# Patient Record
Sex: Male | Born: 1945 | Race: White | Hispanic: No | Smoking: Former smoker
Health system: Southern US, Community
[De-identification: ages and names within clinical notes are randomized; demographics above are authoritative.]

## PROBLEM LIST (undated history)

## (undated) DIAGNOSIS — K219 Gastro-esophageal reflux disease without esophagitis: Secondary | ICD-10-CM

## (undated) DIAGNOSIS — I340 Nonrheumatic mitral (valve) insufficiency: Secondary | ICD-10-CM

## (undated) DIAGNOSIS — I2510 Atherosclerotic heart disease of native coronary artery without angina pectoris: Principal | ICD-10-CM

## (undated) DIAGNOSIS — N200 Calculus of kidney: Secondary | ICD-10-CM

## (undated) DIAGNOSIS — I255 Ischemic cardiomyopathy: Secondary | ICD-10-CM

## (undated) DIAGNOSIS — T7840XA Allergy, unspecified, initial encounter: Secondary | ICD-10-CM

## (undated) DIAGNOSIS — F419 Anxiety disorder, unspecified: Secondary | ICD-10-CM

## (undated) DIAGNOSIS — E890 Postprocedural hypothyroidism: Secondary | ICD-10-CM

## (undated) DIAGNOSIS — R739 Hyperglycemia, unspecified: Secondary | ICD-10-CM

## (undated) DIAGNOSIS — E669 Obesity, unspecified: Secondary | ICD-10-CM

## (undated) DIAGNOSIS — I10 Essential (primary) hypertension: Secondary | ICD-10-CM

## (undated) DIAGNOSIS — C44111 Basal cell carcinoma of skin of unspecified eyelid, including canthus: Secondary | ICD-10-CM

## (undated) DIAGNOSIS — M1990 Unspecified osteoarthritis, unspecified site: Secondary | ICD-10-CM

## (undated) DIAGNOSIS — N401 Enlarged prostate with lower urinary tract symptoms: Secondary | ICD-10-CM

## (undated) DIAGNOSIS — E785 Hyperlipidemia, unspecified: Secondary | ICD-10-CM

## (undated) DIAGNOSIS — I519 Heart disease, unspecified: Secondary | ICD-10-CM

## (undated) DIAGNOSIS — E7800 Pure hypercholesterolemia, unspecified: Secondary | ICD-10-CM

## (undated) DIAGNOSIS — N138 Other obstructive and reflux uropathy: Secondary | ICD-10-CM

## (undated) HISTORY — PX: RENAL BIOPSY: SHX156

## (undated) HISTORY — DX: Heart disease, unspecified: I51.9

## (undated) HISTORY — DX: Calculus of kidney: N20.0

## (undated) HISTORY — DX: Pure hypercholesterolemia, unspecified: E78.00

## (undated) HISTORY — DX: Atherosclerotic heart disease of native coronary artery without angina pectoris: I25.10

## (undated) HISTORY — DX: Anxiety disorder, unspecified: F41.9

## (undated) HISTORY — DX: Benign prostatic hyperplasia with lower urinary tract symptoms: N40.1

## (undated) HISTORY — DX: Ischemic cardiomyopathy: I25.5

## (undated) HISTORY — PX: THYROIDECTOMY: SHX17

## (undated) HISTORY — DX: Hyperglycemia, unspecified: R73.9

## (undated) HISTORY — PX: FINGER AMPUTATION: SHX636

## (undated) HISTORY — PX: APPENDECTOMY: SHX54

## (undated) HISTORY — DX: Nonrheumatic mitral (valve) insufficiency: I34.0

## (undated) HISTORY — DX: Gastro-esophageal reflux disease without esophagitis: K21.9

## (undated) HISTORY — DX: Hyperlipidemia, unspecified: E78.5

## (undated) HISTORY — PX: CARDIAC SURGERY: SHX584

## (undated) HISTORY — DX: Basal cell carcinoma of skin of unspecified eyelid, including canthus: C44.111

## (undated) HISTORY — DX: Postprocedural hypothyroidism: E89.0

## (undated) HISTORY — DX: Obesity, unspecified: E66.9

## (undated) HISTORY — DX: Allergy, unspecified, initial encounter: T78.40XA

## (undated) HISTORY — PX: TONSILLECTOMY AND ADENOIDECTOMY: SUR1326

## (undated) HISTORY — DX: Other obstructive and reflux uropathy: N13.8

## (undated) HISTORY — DX: Essential (primary) hypertension: I10

## (undated) HISTORY — DX: Unspecified osteoarthritis, unspecified site: M19.90

---

## 2000-06-14 DIAGNOSIS — I2510 Atherosclerotic heart disease of native coronary artery without angina pectoris: Secondary | ICD-10-CM

## 2000-06-14 HISTORY — DX: Atherosclerotic heart disease of native coronary artery without angina pectoris: I25.10

## 2001-01-12 HISTORY — PX: PARTIAL NEPHRECTOMY: SHX414

## 2001-01-27 ENCOUNTER — Encounter: Payer: Self-pay | Admitting: Urology

## 2001-02-01 ENCOUNTER — Inpatient Hospital Stay (HOSPITAL_COMMUNITY): Admission: RE | Admit: 2001-02-01 | Discharge: 2001-02-19 | Payer: Self-pay | Admitting: Urology

## 2001-02-01 ENCOUNTER — Encounter (INDEPENDENT_AMBULATORY_CARE_PROVIDER_SITE_OTHER): Payer: Self-pay | Admitting: Specialist

## 2001-02-02 ENCOUNTER — Encounter: Payer: Self-pay | Admitting: Cardiovascular Disease

## 2001-02-09 ENCOUNTER — Encounter: Payer: Self-pay | Admitting: Thoracic Surgery (Cardiothoracic Vascular Surgery)

## 2001-02-15 ENCOUNTER — Encounter: Payer: Self-pay | Admitting: Thoracic Surgery (Cardiothoracic Vascular Surgery)

## 2001-02-16 ENCOUNTER — Encounter: Payer: Self-pay | Admitting: Thoracic Surgery (Cardiothoracic Vascular Surgery)

## 2001-02-17 ENCOUNTER — Encounter: Payer: Self-pay | Admitting: Cardiology

## 2001-02-17 ENCOUNTER — Encounter: Payer: Self-pay | Admitting: Thoracic Surgery (Cardiothoracic Vascular Surgery)

## 2001-02-18 ENCOUNTER — Encounter: Payer: Self-pay | Admitting: Thoracic Surgery (Cardiothoracic Vascular Surgery)

## 2005-06-14 HISTORY — PX: CORONARY ARTERY BYPASS GRAFT: SHX141

## 2005-09-03 ENCOUNTER — Ambulatory Visit: Payer: Self-pay | Admitting: Cardiovascular Disease

## 2005-09-10 ENCOUNTER — Ambulatory Visit: Payer: Self-pay

## 2005-11-11 ENCOUNTER — Encounter: Admission: RE | Admit: 2005-11-11 | Discharge: 2005-11-11 | Payer: Self-pay | Admitting: Endocrinology

## 2005-11-22 ENCOUNTER — Encounter: Admission: RE | Admit: 2005-11-22 | Discharge: 2005-11-22 | Payer: Self-pay | Admitting: Endocrinology

## 2006-04-11 ENCOUNTER — Ambulatory Visit: Payer: Self-pay | Admitting: Gastroenterology

## 2006-04-26 ENCOUNTER — Ambulatory Visit: Payer: Self-pay | Admitting: Gastroenterology

## 2008-04-01 ENCOUNTER — Ambulatory Visit: Payer: Self-pay | Admitting: Cardiovascular Disease

## 2008-04-08 ENCOUNTER — Encounter: Payer: Self-pay | Admitting: Cardiovascular Disease

## 2008-04-08 ENCOUNTER — Ambulatory Visit: Payer: Self-pay

## 2008-04-17 ENCOUNTER — Ambulatory Visit: Payer: Self-pay | Admitting: Cardiovascular Disease

## 2008-04-17 LAB — CONVERTED CEMR LAB
BUN: 22 mg/dL (ref 6–23)
Chloride: 104 meq/L (ref 96–112)
Creatinine, Ser: 1.1 mg/dL (ref 0.4–1.5)
Sodium: 141 meq/L (ref 135–145)

## 2008-04-23 ENCOUNTER — Ambulatory Visit (HOSPITAL_COMMUNITY): Admission: RE | Admit: 2008-04-23 | Discharge: 2008-04-23 | Payer: Self-pay | Admitting: Cardiovascular Disease

## 2008-04-30 ENCOUNTER — Ambulatory Visit: Payer: Self-pay | Admitting: Cardiovascular Disease

## 2008-05-14 DIAGNOSIS — C44111 Basal cell carcinoma of skin of unspecified eyelid, including canthus: Secondary | ICD-10-CM

## 2008-05-14 HISTORY — DX: Basal cell carcinoma of skin of unspecified eyelid, including canthus: C44.111

## 2008-05-17 ENCOUNTER — Ambulatory Visit: Payer: Self-pay | Admitting: Internal Medicine

## 2008-05-22 ENCOUNTER — Ambulatory Visit: Payer: Self-pay

## 2008-06-04 ENCOUNTER — Ambulatory Visit: Payer: Self-pay | Admitting: Cardiovascular Disease

## 2008-06-04 LAB — CONVERTED CEMR LAB
Chloride: 105 meq/L (ref 96–112)
Creatinine, Ser: 0.9 mg/dL (ref 0.4–1.5)
Eosinophils Absolute: 0.3 10*3/uL (ref 0.0–0.7)
Eosinophils Relative: 3.9 % (ref 0.0–5.0)
GFR calc Af Amer: 111 mL/min
GFR calc non Af Amer: 91 mL/min
Hemoglobin: 15.5 g/dL (ref 13.0–17.0)
INR: 0.9 (ref 0.8–1.0)
Neutro Abs: 5 10*3/uL (ref 1.4–7.7)
Neutrophils Relative %: 55.5 % (ref 43.0–77.0)
Potassium: 4.2 meq/L (ref 3.5–5.1)
Prothrombin Time: 9.7 s — ABNORMAL LOW (ref 10.9–13.3)
RBC: 4.8 M/uL (ref 4.22–5.81)
Sodium: 142 meq/L (ref 135–145)

## 2008-06-05 ENCOUNTER — Inpatient Hospital Stay (HOSPITAL_BASED_OUTPATIENT_CLINIC_OR_DEPARTMENT_OTHER): Admission: RE | Admit: 2008-06-05 | Discharge: 2008-06-05 | Payer: Self-pay | Admitting: Cardiology

## 2008-06-05 ENCOUNTER — Ambulatory Visit: Payer: Self-pay | Admitting: Cardiology

## 2008-07-03 ENCOUNTER — Ambulatory Visit: Payer: Self-pay | Admitting: Internal Medicine

## 2008-10-31 DIAGNOSIS — E785 Hyperlipidemia, unspecified: Secondary | ICD-10-CM

## 2008-10-31 DIAGNOSIS — I10 Essential (primary) hypertension: Secondary | ICD-10-CM | POA: Insufficient documentation

## 2008-10-31 DIAGNOSIS — I219 Acute myocardial infarction, unspecified: Secondary | ICD-10-CM | POA: Insufficient documentation

## 2008-10-31 DIAGNOSIS — C649 Malignant neoplasm of unspecified kidney, except renal pelvis: Secondary | ICD-10-CM | POA: Insufficient documentation

## 2008-11-04 ENCOUNTER — Ambulatory Visit: Payer: Self-pay | Admitting: Cardiovascular Disease

## 2008-11-06 ENCOUNTER — Ambulatory Visit: Payer: Self-pay | Admitting: Internal Medicine

## 2009-03-03 ENCOUNTER — Ambulatory Visit: Payer: Self-pay | Admitting: Cardiovascular Disease

## 2009-08-18 ENCOUNTER — Ambulatory Visit: Payer: Self-pay | Admitting: Cardiovascular Disease

## 2010-02-12 ENCOUNTER — Ambulatory Visit: Payer: Self-pay | Admitting: Cardiovascular Disease

## 2010-03-27 ENCOUNTER — Telehealth (INDEPENDENT_AMBULATORY_CARE_PROVIDER_SITE_OTHER): Payer: Self-pay | Admitting: *Deleted

## 2010-07-16 NOTE — Progress Notes (Signed)
  Pt signed ROI for Records to be sent to [facility]) sent to [employer]  March 27, 2010 11:49 AM     Appended Document:  Pt signed ROI for Recent records to be sent to [facility] [place], sent Bypass records from 2002 to Attn: Jan Curtis fax [phone]

## 2010-07-16 NOTE — Assessment & Plan Note (Signed)
Summary: f51m   CC:  check up.  History of Present Illness: Andre Cook is seen today for F/U of ischemic DCM.  His insurance company has denied implantation of an AICD.  I have dictated a letter to clarify this.  He is funcitonal class 2 with CABG in 2002, an occluded SVG to RCA on cath 05/2008.  EF by echo 03/2008 was 25-35% and by MRI 35% with a dense (11% by volume) inferior wall scar.  He ha mild exertinal edema.  No SCCP palpitations mild LE edema and no syncope.  He has bee compliant with his medications. and needs a refill on protonix.  I spoke with Dr. Johney Frame and our research nurse Paula Compton.  Dr. Johney Frame felt the insurance company should cover an AICD and Paula Compton indicated that he couldn't be randomized in the Determine Trial without paying for the procedure out of pocket.  He complains of occasional SSCP which sounds atypical, sharp and shooting across his back He has occasional "PVC;s"  or skikps but no syncope  He thinks the [place] will place a defibrilator and we have faxed his records to Dr Gerlene Fee.  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 Take One Tablet By Mouth Twice A Day 2)  Norvasc 10 Mg Tabs (Amlodipine Besylate) .Marland Kitchen.. 1 Tab By Mouth Once Daily 3)  Pravachol 40 Mg Tabs (Pravastatin Sodium) .Marland Kitchen.. 1 Tab By Mouth Once Daily 4)  Bc Fast Pain Relief 650-195-33.3 Mg Pack (Aspirin-Salicylamide-Caffeine) .... As Needed 5)  Ramipril 10 Mg Caps (Ramipril) .Marland Kitchen.. 1 Tab By Mouth Two Times A Day 6)  Synthroid 112 Mcg Tabs (Levothyroxine Sodium) .Marland Kitchen.. 1 Tab By Mouth Once Daily 7)  Hydrochlorothiazide 25 Mg Tabs (Hydrochlorothiazide) .... Take One Tablet By Mouth Daily. 8)  Cvs  Omeprazole 20 Mg Tbec (Omeprazole) .... Tab By Mouth Once Daily 9)  Niaspan 750 Mg Cr-Tabs (Niacin (Antihyperlipidemic)) .Marland Kitchen.. 1 Tab By Mouth Once Daily 10)  Isosorbide Mononitrate Cr 30 Mg Xr24h-Tab (Isosorbide Mononitrate) .... Take One Tablet By Mouth Daily 11)  Vitamin D 1000 Unit Tabs (Cholecalciferol) .Marland Kitchen.. 1 Tab By Mouth Once Daily  Allergies (verified): No Known Drug Allergies  Past History:  Past Medical History: Last updated: 10/31/2008 . Decreased left ventricular function   Past Surgical History: Last updated: 10/31/2008   Median sternotomy for coronary artery bypass grafting x 4 (left internal mammary artery to distal left anterior descending coronary artery, saphenous vein graft to first diagonal branch, saphenous vein graft to circumflex marginal branch, saphenous vein graft to posterior descending coronary artery). SURGEON:  Salvatore Decent. Cornelius Moras, M.D.  Family History: Last updated: 10/31/2008  noncontributory.   Social History: Last updated: 10/31/2008  He is unmarried.  He has no children.  He does not use   cigarettes or alcohol or recreational drugs.  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      Review of Systems       Denies fever, malais, weight loss, blurry vision, decreased visual acuity, cough, sputum, SOB, hemoptysis, pleuritic pain, palpitaitons, heartburn, abdominal pain, melena, lower extremity edema, claudication, or rash.   Vital Signs:  Patient profile:   65 year old male  Height:      74 inches Weight:      242 pounds BMI:     31.18 Resp:     14 per minute BP sitting:   128 / 72  (left arm)  Vitals Entered By: Kem Parkinson (February 12, 2010 4:01 PM)  Physical Exam  General:  Affect appropriate Healthy:  appears stated age HEENT: normal Neck supple with no adenopathy JVP normal no bruits no thyromegaly Lungs clear with no wheezing and good diaphragmatic motion Heart:  S1/S2 no  murmur,rub, gallop or click PMI normal Abdomen: benighn, BS positve, no tenderness, no AAA no bruit.  No HSM or HJR Distal pulses intact with no bruits No edema Neuro non-focal Skin warm and dry    Impression & Recommendations:  Problem # 1:  CARDIOMYOPATHY, ISCHEMIC EF 35-40% (ICD-414.8) Stable functional class 2 continue current meds His updated medication list for this problem includes:    Metoprolol Tartrate 50 Mg Tabs (Metoprolol tartrate) .Marland Kitchen... Take one tablet by mouth twice a day    Norvasc 10 Mg Tabs (Amlodipine besylate) .Marland Kitchen... 1 tab by mouth once daily    Ramipril 10 Mg Caps (Ramipril) .Marland Kitchen... 1 tab by mouth two times a day    Hydrochlorothiazide 25 Mg Tabs (Hydrochlorothiazide) .Marland Kitchen... Take one tablet by mouth daily.    Isosorbide Mononitrate Cr 30 Mg Xr24h-tab (Isosorbide mononitrate) .Marland Kitchen... Take one tablet by mouth daily  Problem # 2:  HYPERTENSION (ICD-401.9) Well controlled His updated medication list for this problem includes:    Metoprolol Tartrate 50 Mg Tabs (Metoprolol tartrate) .Marland Kitchen... Take one tablet by mouth twice a day    Norvasc 10 Mg Tabs (Amlodipine besylate) .Marland Kitchen... 1 tab by mouth once daily    Ramipril 10 Mg Caps (Ramipril) .Marland Kitchen... 1 tab by mouth two times a day    Hydrochlorothiazide 25 Mg Tabs (Hydrochlorothiazide) .Marland Kitchen... Take one tablet by mouth daily.  Problem # 3:  MYOCARDIAL INFARCTION (ICD-410.90) Ischemic DCM with dense IMI.  Meets criteria for AICD Will see if can be [facility] at [facility]  Records faxed His updated medication list for this problem includes:    Metoprolol Tartrate 50 Mg Tabs (Metoprolol tartrate) .Marland Kitchen... Take one tablet by mouth twice a day    Norvasc 10 Mg Tabs (Amlodipine besylate) .Marland Kitchen... 1 tab by mouth once daily    Ramipril 10 Mg Caps (Ramipril) .Marland Kitchen... 1 tab by mouth two times a day    Isosorbide Mononitrate Cr 30 Mg Xr24h-tab (Isosorbide mononitrate) .Marland Kitchen... Take one tablet by mouth daily Prescriptions: HYDROCHLOROTHIAZIDE 25 MG TABS  (HYDROCHLOROTHIAZIDE) Take one tablet by mouth daily.  #90 x 3   Entered by:   Kem Parkinson   Authorized by:   Colon Branch, MD, [facility]   Signed by:   Kem Parkinson on 02/12/2010   Method used:   Faxed to ...       [facility] (mail-order)       [address] [place], [place]  [address]       Ph: [phone]       Fax: [phone]   RxID:   [id number] RAMIPRIL 10 MG CAPS (RAMIPRIL) 1 tab by mouth two times a day  #180 x 3   Entered by:   Kem Parkinson   Authorized by:   Colon Branch, MD, [facility]   Signed by:   Kem Parkinson on 02/12/2010   Method used:   Faxed to .Marland KitchenMarland Kitchen  [facility] (mail-order)       [address] [place], [place]  [address]       Ph: [phone]       Fax: [phone]   RxID:   [id number] METOPROLOL TARTRATE 50 MG TABS (METOPROLOL TARTRATE) Take one tablet by mouth twice a day  #180 x 3   Entered by:   Kem Parkinson   Authorized by:   Colon Branch, MD, [facility]   Signed by:   Kem Parkinson on 02/12/2010   Method used:   Faxed to ...       [facility] (mail[facility]order)       [address] [place], [place]  [address]       Ph: [phone]       Fax: [phone]   RxID:   [id number] NORVASC 10 MG TABS (AMLODIPINE BESYLATE) 1 tab by mouth once daily  #90 x 3   Entered by:   Kem Parkinson   Authorized by:   Colon Branch, MD, [facility]   Signed by:   Kem Parkinson on 02/12/2010   Method used:   Faxed to ...       [facility] (mail[facility]order)       [address] [place], [place]  [address]       Ph: [phone]       Fax: [phone]   RxID:   [id number] HYDROCHLOROTHIAZIDE 25 MG TABS (HYDROCHLOROTHIAZIDE) Take one tablet by mouth daily.  #90 x 3   Entered by:   Kem Parkinson   Authorized by:   Colon Branch, MD, [facility]   Signed by:   Kem Parkinson on 02/12/2010   Method used:   Electronically to        [facility] [address] (retail)       [address] [place], [place]  [address]       Ph: [phone]       Fax: [phone]   RxID:   [id number] RAMIPRIL 10 MG CAPS (RAMIPRIL) 1 tab by mouth two times a day  #180 x 3   Entered by:   Kem Parkinson   Authorized by:   Colon Branch, MD, [facility]   Signed by:   Kem Parkinson on 02/12/2010   Method used:   Electronically to        [facility] [address] (retail)       [address] [place], [place]  [address]       Ph: [phone]       Fax: [phone]   RxID:   [id number] NORVASC 10 MG TABS (AMLODIPINE BESYLATE) 1 tab by mouth once daily  #90 x 3   Entered by:   Kem Parkinson   Authorized by:   Colon Branch, MD, [facility]   Signed by:   Kem Parkinson on 02/12/2010   Method used:   Electronically to        [facility] [address] (retail)       [address]       [place], [place]  [address]       Ph: [phone]  Fax: [phone]   RxID:   [id number] METOPROLOL TARTRATE 50 MG TABS (METOPROLOL TARTRATE) Take one tablet by mouth twice a day  #180 x 3   Entered by:   Kem Parkinson   Authorized by:   Colon Branch, MD, [facility]   Signed by:   Kem Parkinson on 02/12/2010   Method used:   Electronically to        [facility] [address] (retail)       [address] [place], [place]  [address]       Ph: [phone]       Fax: [phone]   RxID:   [id number]

## 2010-07-16 NOTE — Assessment & Plan Note (Signed)
Summary: F6M/DM   CC:  chest pain within the last time  pt was seen.  History of Present Illness: Andre Cook is seen today for F/U of ischemic DCM.  His insurance company has denied implantation of an AICD.  I have dictated a letter to clarify this.  He is funcitonal class 2 with CABG in 2002, an occluded SVG to RCA on cath 05/2008.  EF by echo 03/2008 was 25-35% and by MRI 35% with a dense (11% by volume) inferior wall scar.  He ha mild exertinal edema.  No SCCP palpitations mild LE edema and no syncope.  He has bee compliant with his medications. and needs a refill on protonix.  I spoke with Dr. Johney Frame and our research nurse Paula Compton.  Dr. Johney Frame felt the insurance company should cover an AICD and Paula Compton indicated that he couldn't be randomized in the Determine Trial without paying for the procedure out of pocket.  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 Take One Tablet By Mouth Twice A Day 2)  Norvasc 10 Mg Tabs (Amlodipine Besylate) .Marland Kitchen.. 1 Tab By Mouth Once Daily 3)  Pravachol 40 Mg Tabs (Pravastatin Sodium) .Marland Kitchen.. 1 Tab By Mouth Once Daily 4)  Bc Fast Pain Relief 650-195-33.3 Mg Pack (Aspirin-Salicylamide-Caffeine) .... As Needed 5)  Ramipril 10 Mg Caps (Ramipril) .Marland Kitchen.. 1 Tab By Mouth Two Times A Day 6)  Synthroid 112 Mcg Tabs (Levothyroxine Sodium) .Marland Kitchen.. 1 Tab By Mouth Once Daily 7)  Hydrochlorothiazide 25 Mg Tabs (Hydrochlorothiazide) .... Take One Tablet By Mouth Daily. 8)  Cvs Omeprazole 20 Mg Tbec (Omeprazole) .... Tab By Mouth  Once Daily 9)  Niaspan 750 Mg Cr-Tabs (Niacin (Antihyperlipidemic)) .Marland Kitchen.. 1 Tab By Mouth Once Daily 10)  Isosorbide Mononitrate Cr 30 Mg Xr24h-Tab (Isosorbide Mononitrate) .... Take One Tablet By Mouth Daily  Allergies (verified): No Known Drug Allergies  Past History:  Past Medical History: Last updated: 10/31/2008 . Decreased left ventricular function   Past Surgical History: Last updated: 10/31/2008   Median sternotomy for coronary artery bypass grafting x 4 (left internal mammary artery to distal left anterior descending coronary artery, saphenous vein graft to first diagonal branch, saphenous vein graft to circumflex marginal branch, saphenous vein graft to posterior descending coronary artery). SURGEON:  Salvatore Decent. Cornelius Moras, M.D.  Family History: Last updated: 10/31/2008  noncontributory.   Social History: Last updated: 10/31/2008  He is unmarried.  He has no children.  He does not use   cigarettes or alcohol or recreational drugs.  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      Review of Systems       Denies fever, malais, weight loss, blurry vision, decreased visual acuity, cough, sputum,  hemoptysis, pleuritic pain, , heartburn, abdominal pain, melena, lower extremity edema, claudication, or rash.   Vital Signs:  Patient profile:   65 year old male Height:      74 inches Weight:      250 pounds BMI:     32.21 Pulse rate:  62 / minute Resp:     14 per minute BP sitting:   134 / 71  (left arm)  Vitals Entered By: Kem Parkinson (August 18, 2009 2:02 PM)  Physical Exam  General:  Affect appropriate Healthy:  appears stated age HEENT: normal Neck supple with no adenopathy JVP normal no bruits no thyromegaly Lungs clear with no wheezing and good diaphragmatic motion Heart:  S1/S2 no murmur,rub, gallop or click PMI normal Abdomen: benighn, BS positve, no tenderness, no AAA no bruit.  No HSM or HJR Distal  pulses intact with no bruits No edema Neuro non-focal Skin warm and dry    Impression & Recommendations:  Problem # 1:  CARDIOMYOPATHY, ISCHEMIC EF 35-40% (ICD-414.8) No angina.  Call [place] to get formal denial??  Meets criteria for AICD as far as I can tell His updated medication list for this problem includes:    Metoprolol Tartrate 50 Mg Tabs (Metoprolol tartrate) .Marland Kitchen... Take one tablet by mouth twice a day    Norvasc 10 Mg Tabs (Amlodipine besylate) .Marland Kitchen... 1 tab by mouth once daily    Ramipril 10 Mg Caps (Ramipril) .Marland Kitchen... 1 tab by mouth two times a day    Hydrochlorothiazide 25 Mg Tabs (Hydrochlorothiazide) .Marland Kitchen... Take one tablet by mouth daily.    Isosorbide Mononitrate Cr 30 Mg Xr24h-tab (Isosorbide mononitrate) .Marland Kitchen... Take one tablet by mouth daily  Problem # 2:  CARDIOMYOPATHY, ISCHEMIC EF 35-40% (ICD-414.8) Functional class 2 with stable exertional dyspnea.  No overt CHF.  Appears euvolemic.  Consider BMET and BNP next visit His updated medication list for this problem includes:    Metoprolol Tartrate 50 Mg Tabs (Metoprolol tartrate) .Marland Kitchen... Take one tablet by mouth twice a day    Norvasc 10 Mg Tabs (Amlodipine besylate) .Marland Kitchen... 1 tab by mouth once daily    Ramipril 10 Mg Caps (Ramipril) .Marland Kitchen... 1 tab by mouth two times a day    Hydrochlorothiazide 25 Mg Tabs (Hydrochlorothiazide) .Marland Kitchen... Take one tablet by mouth daily.    Isosorbide Mononitrate Cr 30 Mg Xr24h-tab (Isosorbide mononitrate) .Marland Kitchen... Take one tablet by mouth daily  Problem # 3:  DYSLIPIDEMIA (ICD-272.4) F/U labs per primary.  Target LDL less than 70 given old bypass grafts His updated medication list for this problem includes:    Pravachol 40 Mg Tabs (Pravastatin sodium) .Marland Kitchen... 1 tab by mouth once daily    Niaspan 750 Mg Cr-tabs (Niacin (antihyperlipidemic)) .Marland Kitchen... 1 tab by mouth once daily  Problem # 4:  HYPERTENSION (ICD-401.9) Well ctonrooled His updated medication list for this problem includes:    Metoprolol Tartrate  50 Mg Tabs (Metoprolol tartrate) .Marland Kitchen... Take one tablet by mouth twice a day    Norvasc 10 Mg Tabs (Amlodipine besylate) .Marland Kitchen... 1 tab by mouth once daily    Ramipril 10 Mg Caps (Ramipril) .Marland Kitchen... 1 tab by mouth two times a day    Hydrochlorothiazide 25 Mg Tabs (Hydrochlorothiazide) .Marland Kitchen... Take one tablet by mouth daily.  Prevention & Chronic Care Immunizations   Influenza vaccine: Not documented    Tetanus booster: Not documented    Pneumococcal vaccine: Not documented    H. zoster vaccine: Not documented  Colorectal Screening   Hemoccult: Not documented    Colonoscopy: Not documented  Other Screening   PSA: Not documented   Smoking status: Not documented  Lipids   Total Cholesterol: Not documented   LDL: Not documented   LDL Direct: Not documented   HDL: Not documented   Triglycerides: Not documented  SGOT (AST): Not documented   SGPT (ALT): Not documented   Alkaline phosphatase: Not documented   Total bilirubin: Not documented  Hypertension   Last Blood Pressure: 134 / 71  (08/18/2009)   Serum creatinine: 0.9  (06/04/2008)   Serum potassium 4.2  (06/04/2008)  Self-Management Support :    Hypertension self-management support: Not documented    Lipid self-management support: Not documented    EKG Report  Procedure date:  08/18/2009  Findings:      SR 64 Old IMI Lateral T wave changes Abnormal ECG QT 438 PR 212

## 2010-09-07 LAB — CBC AND DIFFERENTIAL
Hemoglobin: 15.4 g/dL (ref 13.5–17.5)
Neutrophils Absolute: 51 /uL
Platelets: 279 10*3/uL (ref 150–399)

## 2010-09-07 LAB — BASIC METABOLIC PANEL
Creatinine: 1 mg/dL (ref <=1.3)
Glucose: 106 mg/dL

## 2010-09-07 LAB — HEPATIC FUNCTION PANEL: Bilirubin, Total: 0.6 mg/dL

## 2010-10-27 NOTE — Cardiovascular Report (Signed)
NAME:  Andre [name], Andre [name] NO.:  [id number]   MEDICAL RECORD NO.:  [id number]          PATIENT TYPE:  OIB   LOCATION:  1962                         FACILITY:  [facility]   PHYSICIAN:  Everardo Beals. Juanda Chance, MD, FACCDATE OF BIRTH:  1946-04-13   DATE OF PROCEDURE:  06/05/2008  DATE OF DISCHARGE:  06/05/2008                            CARDIAC CATHETERIZATION   CLINICAL HISTORY:  Mr. Aguayo is 65 year old and had previous bypass  surgery in 2002 following a myocardial infarction.  He had ejection  fraction of 30-35% by echo and cardiac MRI.  He had some exertional arm  pain and Dr. Graciela Husbands and Dr. Eden Emms arranged for him to have angiography  today.   PROCEDURE:  The procedure was performed via the right femoral artery and  arterial sheath and 4-French preformed coronary catheters.  A front wall  arterial puncture was performed and Omnipaque contrast was used.  We  used an AL-1 catheter for injection of the vein graft to the diagonal  and circumflex arteries and we used a multipurpose catheter for  injection of the vein graft to the right coronary artery.  A 3-D  catheter was used for the LIMA graft.  The patient tolerated the  procedure well and left the Laboratory in satisfactory condition.   RESULTS:  Aortic pressure was 132/64 with a mean of 89 and left  ventricle pressure is 132/19.   Left main coronary artery.  The left main coronary artery was free of  significant disease.   Left anterior descending artery.  Left anterior descending artery was  completely occluded after a large septal perforator.   Circumflex artery.  The circumflex artery was completely occluded after  a small ramus branch.   Right coronary artery.  The right coronary artery was completely  occluded at its origin.   The saphenous vein graft to the posterolateral branch of the circumflex  artery was patent.  There was 30% narrowing in the mid-to-distal  portion.   The saphenous vein graft to diagonal  branch of the LAD was patent and  was free of significant disease.   The LIMA graft to the LAD was patent and functioned normally.   The vein graft to the right coronary artery was completely occluded.  The native distal right coronary artery filled via collaterals from the  vein graft to the circumflex artery.   Left ventriculogram.  The underlying left ventriculogram performed on  the RAO projection showed akinesis of the mid inferior and inferobasal  walls.  The apex and anterolateral wall moved reasonably well.  The  estimated fraction by angiography was 35-40%.   CONCLUSION:  1. Coronary artery disease, status post coronary artery bypass graft      surgery in 2002.  2. 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  3. Patent vein graft to diagonal branch of the LAD, patent vein graft      to the posterolateral branch of the circumflex artery with 30%  narrowing in the mid-to-distal portion, patent LIMA graft to the      LAD, and occluded vein graft to the right coronary  4. Inferior wall akinesis with an estimated fraction of 35-40%.   RECOMMENDATIONS:  The patient has an occluded vein graft, but he is not  a candidate for revascularization.  We will plan continued medical  therapy.  Dr.  Eden Emms plans referral to Dr. Graciela Husbands for consideration for  an ICD.      Bruce Elvera Lennox Juanda Chance, MD, [facility]  Electronically Signed     BRB/MEDQ  D:  06/05/2008  T:  06/05/2008  Job:  [id number]   cc:   [name]. Eden Emms, MD, [facility]  Duke Salvia, MD, [facility]  [facility]

## 2010-10-27 NOTE — Assessment & Plan Note (Signed)
[facility]                            CARDIOLOGY OFFICE NOTE   NAME:JONESKin, Galbraith                      MRN:          [id number]  DATE:04/01/2008                            DOB:          1945/12/27    Mr. Seeman was referred by Dr. Milus Glazier from [facility] for  further followup of his coronary artery disease.  The patient last saw  me in 2007.  I am not sure why he does not follow up with [place] on a  regular basis.   He has a history of an inferior lateral wall MI.  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   He had severe three-vessel disease with chronically occluded right  coronary artery and occluded mid circ.  His LV function has been mildly  decreased in the past; however, it has not been checked in quite some  time I believe biventriculography was 35-40%.   In talking to the patient, he does get occasional exertional dyspnea, it  seems stable.  He has been going to the gym.  He reports walking 3-4  miles a day for every other day at the gym.   He is not having significant chest pain.  He has trace lower extremity  edema.   His blood pressure and sugars have been in the little bit hard to  control.  Looking back through his chart, he has gained an enormous  amount of weight over the last few years and this up to 267 pounds.  His  last Myoview done in March 2007, it was thought to be low risk with  inferior lateral wall scar and mild peri-infarct ischemia.  No change  from 2004, the EF that time was estimated at 39%.   The patient's review of systems otherwise negative in particular he has  not had palpitations, presyncope, or chest pain.  There has been no  diaphoresis.   He has not followup with Dr. Isabel Caprice.   He has no known allergies.   MEDICATIONS:  1. BC powder.  2. Altace 20 a day.  3. Toprol 50 b.i.d.  4. Norvasc 10 a day.  5. Pravachol 40  a day.  6. Synthroid 112 mcg a day.  7. HCTZ 25 a day.  8. Omeprazole 20 a day.  9. Niaspan 750 a day.   The patient is divorced.  He has nieces in the area.  He is retired from  the post office.  He is a previous heavy smoker.  He does not drink.  He  is otherwise trying to be more active.   I talked to him at length.  His diet is somewhat poor.  He does not have  a lot of insight into the sugar or carbohydrate content of a lot of the  food or juices that he drinks.   I reviewed his records from [facility], including an EKG which  showed an old inferior lateral wall MI and occasional PVC.  His lab work  showed a glucose  of 109, normal LFTs, CBC was within good limits,  cholesterol total was 182, LDL was 76,  TSH was 2.6, and I did not see a  PSA.   His past medical history is otherwise remarkable for:  1. Hypercholesterolemia.  2. Inferior lateral wall MI.  3. CABG.  4. Hypertension.  5. Trace lower extremity edema.  6. Renal cell carcinoma with partial nephrectomy.   Family history is noncontributory.   Exam is remarkable for an overweight white male, no distress.  Weight is 267, blood pressure 150/80, pulse 67 and regular, respiratory  rate 14, afebrile.  HEENT:  Unremarkable.  NECK:  Carotids are normal without bruit.  No lymphadenopathy,  thyromegaly, or JVP elevation.  LUNGS:  Clear.  Good diaphragmatic motion.  No wheezing.  HEART:  S1 and S2 with normal heart sounds.  PMI distant.  ABDOMEN:  Benign.  Status post partial nephrectomy.  No AAA.  No  tenderness.  No bruit.  No hepatosplenomegaly.  No hepatojugular reflux  or tenderness.  EXTREMITIES:  Distal pulses are intact with trace edema.  NEURO:  Nonfocal.  SKIN:  Warm and dry.  No muscular weakness.   EKG shows sinus rhythm with inferior lateral wall MI.   IMPRESSION:  1. Coronary disease, previous coronary artery bypass graft, not having      chest pain, active at the gym.  I do not see an  indication for      stress testing particularly since he has had occluded right  and      circumflex vessels that were collateralized.  I suspect he will      always have some peri-infarct ischemia.  2. Dyspnea in the setting of previous inferior lateral wall myocardial      infarction.  I did see a premature ventricular contraction in one      of his EKGs.  I would like to do a repeat echocardiogram to assess      his left ventricular function and make sure that he has not fallen      below 35% with remodelling.  3. Hypertension, borderline control.  Continue low-salt diet and needs      to continue to lose weight.  I consider switching Lopressor to      labetalol if blood pressures is continue to be borderline, I will      leave this up to [facility].  4. Hypothyroidism.  Continue Synthroid 112 mcg a day.  TSH in a good      range.  5. History of partial nephrectomy.  Follow up with Dr. Isabel Caprice, needs      to have a routine followup with prostate-specific antigens.  6. Hypercholesterolemia in the setting of coronary disease.  Continue      Niaspan and Pravachol.  LDL in target range.   So long as his echo does not show marked change in LV function, I will  see him back in a year.     [name]. Eden Emms, MD, [facility]  Electronically Signed    PCN/MedQ  DD: 04/01/2008  DT: 04/01/2008  Job #: [id number]

## 2010-10-27 NOTE — Assessment & Plan Note (Signed)
[facility]                            CARDIOLOGY OFFICE NOTE   NAME:Andre [name], Andre [name]                      MRN:          [id number]  DATE:04/30/2008                            DOB:          10/26/1945    Mr. Bither returns today for followup.  Initially, I saw him as a new  patient, October 19.  He had previously been seen in 2007 and lost to  follow up until he was re-referred back.  He has a history of an  inferior wall MI with coronary artery bypass surgery in 2002.  Unfortunately, this occurred after a partial right nephrectomy for renal  cell carcinoma.  He was fairly sick at that time.  The patient has been  doing well.  He is active.  He actually goes to a gym.  He is functional  class I.  I was concerned looking at him with his previous inferior wall  MI and history of decreased LV function that he needed to be further  restratified in regards to his risk for sudden death.   His EF had been reported in the past to be 35-40%.   We did a cardiac MRI on him.  I reviewed the results with the patient.  He had mild-to-moderate LV cavity enlargement.  He had a full-thickness  scar in the inferior wall.  His EF was quantitated at 35%.   I explained to Andre [name] that given his history of coronary disease.  His  LV dysfunction and full thickness scar in the inferior wall that he was  at risk for VT, I will refer him to our EP doctors for further  assessment of whether or not he would need an AICD.  I have not cathed  him yet since I am not sure how this decision will go and he is  currently not having chest pain and is fairly active.  Certainly, we can  look at his vasculature if the decision is made to proceed with an EP  evaluation of her AICD.   The patient may also be a candidate to be randomized in the determine  trial.  All of this was explained to Andre [name] in detail.  He is willing  to talk to the EP doctors.   Otherwise, he is doing well.  He  has been compliant with his meds.  He  is not having any edema.  He is not having PND or orthopnea.  He has not  had palpitations or syncope.  He is on Toprol 100 a day, Norvasc 10 a  day, Pravachol 40 a day, aspirin a day, ramipril 10 b.i.d., Synthroid  112 mcg a day, HCTZ 25 a day, omeprazole, and Niaspan.   PHYSICAL EXAMINATION:  GENERAL:  Remarkable for an overweight white male  in no distress.  VITAL SIGNS:  His weight is 262, blood pressure 140/68, pulse 16 and  regular, respiratory rate 14, afebrile.  HEENT:  Unremarkable.  Carotids normal without bruit.  No  lymphadenopathy, thyromegaly, or JVP elevation.  LUNGS:  Clear.  Good diaphragmatic motion.  No wheezing.  S1 and S2  with  distant heart sounds.  PMI not palpable.  ABDOMEN:  Benign.  Bowel sounds positive.  No AAA, no tenderness, no  bruit, no hepatosplenomegaly, hepatojugular reflux, or tenderness.  EXTREMITIES:  Distal pulses were intact.  No edema.  NEURO:  Nonfocal.  Skin:  Warm and dry.  No muscular weakness.   IMPRESSION:  1. Coronary disease, previous coronary artery bypass graft,  is not      having angina.  Continue aspirin and beta-blocker.  2. Decreased left ventricular function with inferior wall scar,      currently euvolemic functional class I.  Continue current dose of      Lasix.  3. PT risk stratification in the setting of his MRI results.  I will      refer him to EP for further evaluation for either EP testing or      defibrillator.  4. Hypertension, currently well controlled.  At some point it may be      worthwhile to try to increase his ramipril.  I do not like him on      Norvasc, but he currently appears to need if his blood pressure      control.  Continue low-sodium diet.  5. Hypercholesterolemia in the setting of old bypass graft.  Continue      Pravachol.  Lipid and liver profile in 6 months.  6. Hypothyroidism.  Followup with Dr. Faustino Congress.  Continue Synthroid      replacement.  TSH in 6  months.  7. History of reflux, on omeprazole.  Encouraged weight loss.  Avoid      late-night meals and recumbency on a full stomach.  8. Significant time was spent with Leonette Most explaining the referral to      EP for possible defibrillator or EP testing.  I will see him back      in 6 months.     Andre [name]. Eden Emms, MD, [facility]  Electronically Signed    PCN/MedQ  DD: 04/30/2008  DT: 04/30/2008  Job #: [id number]

## 2010-10-27 NOTE — Letter (Signed)
March 03, 2009    [facility]   RE:  Andre [name], Andre [name]  MRN:  [id number]  /  DOB:  Nov 14, 1945   To Whom It May Concern:   This letter is written on behalf of [name].  [name] is a  patient of [facility].  He has a known ischemic cardiomyopathy.  His ejection fraction by echocardiogram done April 08, 2008 was 25-  35%.  He had a followup cardiac MRI that was performed April 23, 2008.  He had a full-thickness inferior wall scar with a quantitative  ejection fraction of 35%.  The patient is status post bypass surgery in  2002.  He had a heart catheterization performed June 05, 2008, which  showed an occluded graft to the right coronary artery and significant  ischemic cardiomyopathy.  The patient should qualify for an AICD based  on class II functional status with known ischemic cardiomyopathy and an  EF of 35%.  Apparently, the insurance company has denied him coverage  for an AICD.   I would appreciate a letter back from the insurance company since by all  criteria, functional class to the patient with an EF of 35% known  coronary artery disease with a dense inferior wall infarct.  He is at  significant risk for sudden death and should qualify for an AICD.   I would like to know what protocols or evidence based medicine Tedd Sias  is using to deny patients, who clearly qualify by national standards a  defibrillator.   Your attention to this matter would be greatly appreciated.    Sincerely,      [name]. Eden Emms, MD, [facility]  Electronically Signed    PCN/MedQ  DD: 03/03/2009  DT: 03/04/2009  Job #: [id number]

## 2010-10-27 NOTE — Assessment & Plan Note (Signed)
[place] [facility]                         ELECTROPHYSIOLOGY OFFICE NOTE   NAME:[name], [name]                      MRN:          [id number]  DATE:07/03/2008                            DOB:          Dec 13, 1945    Andre [name] is seen following catheterization for coronary artery disease,  depressed left ventricular function, and jaw discomfort, which was  thought to be anginal equivalent.  He has had no jaw discomfort  recently.  He does describe a sudden fatigue if he tries to push himself  too hard either in a swimming pool or while running.   He underwent catheterization demonstrating occlusion of his RCA graft.  His native vessels were all occluded.  His vein graft to his LAD, vein  graft to his posterolateral was okay.  His LIMA which was also okay.  Estimated ejection fraction was 35-40%.  He is consented to determine  and he has had his MRI reviewed with 11.3% scar qualifying him for  randomization.   His medications currently include metoprolol 50 q. 12, ramipril 20 a  day, Niaspan 750, omeprazole 20, hydrochlorothiazide 25, Pravachol 40,  and Norvasc 10.   On examination, his blood pressure was 136/66 with a weight of 259,  which is down about 10 pounds the last 2 months.  His lungs were clear.  Heart sounds were regular.  The abdomen was soft but protuberant.  Extremities had no edema.  He is in no acute distress.   IMPRESSION:  1. Coronary artery disease with a prior bypass, being ejection      fraction of 35%-40% range.  2. Left ventricular infarct mass at 11.3% qualifying for randomization      as part determined.   Andre [name] will be randomized as part of the determine trial to ICD or  medical therapy.      Duke Salvia, MD, [facility]  Electronically Signed    SCK/MedQ  DD: 07/03/2008  DT: 07/04/2008  Job #: [id number]

## 2010-10-27 NOTE — Letter (Signed)
May 17, 2008    [name]. Andre Emms, MD, [facility]  [address]  [address]  [place], [place] [address]   RE:  Andre [name]  MRN:  [id number]  /  DOB:  [birth year]   Dear Andre [name],   It was a pleasure to seeing Andre [name] in consultation of ICD  implantation.   As you know, he is a 65 year old gentleman who has a history of ischemic  heart disease with myocardial infarction in 2002 followed by bypass  surgery x3.  He has had progressive deterioration of his LV systolic  function, so the most recent assessment after a 2-year hiatus and  followup undertaken in the fall demonstrated ejection fraction of 25-30%  by echo and MRI scan confirming a depressed LV function of about 35%  with a large inferior scar.  There was by Myoview scanning a number of  years ago evidence of some redistribution in the inferior wall.   This may have some import as the patient continues to have exertional  jaw discomfort which is his anginal equivalent.  He says this is less,  now that it has been over the last couple of years as he is try to lose  weight.  The weight is an interesting issue.  When he was first seen in  2003, his weight was 200 pounds or so.  When he was seen again in 2007,  he was not weighed and when he was seen in the fall of 2009, he was 267  pounds.   He denies any history of syncope or palpitations.  He has no edema,  nocturnal dyspnea, or orthopnea.  He has had no dyspnea on exertion.  He  could walk, however, far he wants, and he has no exertional chest  discomfort.   His past medical history is notable for,  1. Dyslipidemia.  2. Hypothyroidism.  3. GE reflux disease.  4. Hypertension.   His past surgical history is notable for bypass surgery.   SOCIAL HISTORY:  He is unmarried.  He has no children.  He does not use  cigarettes or alcohol or recreational drugs.  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   MEDICATIONS:  1.  Toprol 100.  2. Norvasc 10.  3. Pravachol 40.  4. Ramipril 10 b.i.d.  5. Synthroid 112.  6. Hydrochlorothiazide 25.  7. Omeprazole 20.  8. Niaspan 75.   He has no known drug allergies.   His review of systems is otherwise broadly negative and noncontributory.  The patient had kidney mass for which he underwent biopsy that was  negative and past surgical history is notable for bypass surgery as well  as thyroidectomy.   His family history is noncontributory.   On examination, his blood pressure was 120/70, his pulse was 60.  His  weight was 260 and he is in no acute distress.  His HEENT exam  demonstrated no icterus or xanthoma.  His neck veins were flat.  The  carotids were brisk and full bilaterally without bruits.  The back was  without kyphosis or scoliosis.  His lungs were clear.  Heart sounds were  regular without murmurs or gallops.  The abdomen was protuberant, but  soft.  There was no hepatomegaly.  His femoral pulses were 2+, distal  pulses were trace.  There was no clubbing, cyanosis or edema.  Neurological exam was grossly normal.  His skin was warm and dry.  Electrocardiogram dated October demonstrated sinus rhythm at 70 with  intervals of 0.19/0.10/0.43.  The axis was normal at 60 degrees and  there was no isolated right bundle-branch block PVC.   IMPRESSION:  MRI scan was as noted above.   IMPRESSION:  1. Ischemic heart disease.      a.     Status post coronary artery bypass grafting.      b.     Status post myocardial infarction.      c.     Inferolateral scar.   SUMMARY DISTRIBUTION:  1. Ongoing chronic exertional chest discomfort (jaw).  2. MRI confirmation of the aforementioned data with ejection fraction      of 30-35%.  3. Obesity.   DISCUSSION:  Andre [name] has ischemic heart disease with depressed left  ventricular prior scar.  His MRI scan certainly allow him to be followed  by determine in the registry given his depressed LV function.   I have  reviewed the above with him and he would like to proceed.  We  have discussed the potential benefits as well as potential risks  including but limited to death, perforation, device malfunction, and  lead dislodgement.  He understands.   The other issue was whether he needs to undergo repeat Myoview scanning  given the abnormalities that were listed previously.  I mentioned to  treat briefly in office and the question was whether he needed to go  straight to catheterization.  I will ask you to review the scan on  Monday when you return and we can make that decision.   Thanks very much for the consultation.    Sincerely,      Andre Salvia, MD, [facility]  Electronically Signed    SCK/MedQ  DD: 05/17/2008  DT: 05/18/2008  Job #: [id number]

## 2010-10-27 NOTE — Assessment & Plan Note (Signed)
[facility]                            CARDIOLOGY OFFICE NOTE   NAME:JONESShain, Pauwels                      MRN:          [id number]  DATE:06/04/2008                            DOB:          11/13/1945    Billey Gosling returns today for followup.   He saw Dr. Graciela Husbands on May 17, 2008.   The patient has a history inferior wall MI with bypass in 2002, he was  lost a followup for while.   When I saw him, he was having some jaw discomfort that may have been an  anginal equivalent.   He saw stress Myoview study, he had a stress Myoview study and had an EF  of 39% with an inferolateral wall scar and mild-to-moderate periinfarct  ischemia.   The patient has had a 2-D echocardiogram.  His EF was 25-35% on April 08, 2008.  I had referred him to Dr. Berton Mount for possible AICD  implantation.   Dr. Graciela Husbands had suggested a cardiac MRI.  This was done on April 23, 2008.  He had a quantitative EF of 35% with full-thickness scar  involving the inferior septum and a 2/3 thickness scar involving in the  inferior wall and mid apical wall.   Since Halvor continues to have some jaw pain, the thought was that he  need a heart cath to assess his bypass grafts.   The patient has had a partial right nephrectomy; however, he has normal  renal function.   He has no dye allergy.  The risks of catheterization including stroke,  need for emergency redo bypass, bleeding, and dye reaction were  discussed with the patient.  As I recall his bypass grafts included a  mammary to the LAD, vein graft to the PDA, vein graft to the circ, and  vein graft to the diagonal.   These were done in August 2002 by Dr. Cornelius Moras.   CURRENT MEDICATIONS:  1. Toprol 100 a day.  2. Norvasc 10 a day.  3. Pravachol 40 a day.  4. BC Powder.  5. Ramipril 10 b.i.d.  6. Synthroid 112 mcg a day.  7. HCTZ 25 a day.  8. Omeprazole 20 a day.  9. Niaspan 750 a day.   PHYSICAL EXAMINATION:   GENERAL:  Remarkable for overweight male in no  distress.  VITAL SIGNS:  His weight is 259, blood pressure 148/80, pulse 66,  respiratory rate 14, and afebrile.  HEENT:  Unremarkable.  NECK:  Carotids are without bruit.  No lymphadenopathy, thyromegaly, or  JVP elevation.  LUNGS:  Clear.  Good diaphragmatic motion.  No wheezing.  CARDIAC:  S1 and S2.  Normal heart sounds.  PMI not palpable.  ABDOMEN:  Benign.  Bowel sounds positive.  No AAA, no tenderness, no  bruit, no hepatosplenomegaly, no hepatojugular reflux.  Status post partial right nephrectomy.  EXTREMITIES:  Distal pulses are intact.  No edema.  NEUROLOGIC:  Nonfocal.  SKIN:  Warm and dry.  MUSCULOSKELETAL:  No muscular weakness.   His baseline EKG shows sinus rhythm with inferior wall MI and PVCs.   IMPRESSION:  1. Coronary artery disease with jaw pain, abnormal Myoview, inferior      wall infarct with periinfarct ischemia.  Catheterization to be done      to assess graft patency.  Chest x-ray and lab work to be reviewed      and done today.  We will try to arrange for the JV lab tomorrow.  2. Question need for automatic implantable cardioverter-defibrillator,      decreased left ventricular function with significant substrate for      ventricular tachycardia by MRI, ejection fraction 35%.  I will      refer him back to Dr. Graciela Husbands after we assured his grafts are patent      for possible automatic implantable cardioverter-defibrillator.  3. Hypertension, currently well controlled.  Continue current dose of      medications including ramipril, given decreased left ventricular      function.  4. History of reflux, currently on omeprazole, depending on whether or      not he needs revascularizations, this may need to be switched if he      goes on Plavix.  5. Hypercholesterolemia with old bypass graft.  Continue Pravachol.      Lipid and liver profile in 6 months.     [name]. Eden Emms, MD, [facility]  Electronically  Signed    PCN/MedQ  DD: 06/04/2008  DT: 06/04/2008  Job #: [id number]

## 2010-10-30 NOTE — Cardiovascular Report (Signed)
[place]. [facility]  Patient:    Andre Cook, Andre Cook Visit Number: [id number] MRN: [id number]          Service Type: MED Location: MICU 2112 01 Attending Physician:  Colon Branch Dictated by:   [name] Eden Emms, M.D. [facility] Proc. Date: 02/02/01 Adm. Date:  02/02/2001   CC:         Barron Alvine, M.D.  Jamison Neighbor, M.D.  Glenford Peers, M.D., [facility]   Cardiac Catheterization  PROCEDURE PERFORMED:  Coronary arteriography.  INDICATIONS:  The patient is a 65 year old, patient of Dr. Isabel Caprice and Dr. Teryl Lucy.  He was taken emergently to the catheterization lab and he was 24 hours status post partial right nephrectomy for renal cell CA.  The patient is a long time smoker.  In talking to the patient, he has had angina pectoris but I am not sure he told anybody about this preoperatively.  The patient had severe hypertension, tachycardia and ECG changes postoperative.  A echocardiogram done emergently at the bedside showed an EF of 35-40% with inferior wall akinesis and mild MR.  After discussing the risk with the patient and his sister, he was brought urgently to the catheterization lab. Initial therapy at [facility] included oxygen, IV Lopressor, heparin, aspirin, and two units of packed red blood cells.  In the catheterization laboratory the patient was stable.  Blood pressure was 130/80, pulse was 70 and regular.  Coronary arteriography was as follows:  Left main coronary artery had a 20% discrete stenosis.  Left anterior descending artery was a large vessel.  There was a 95% mid vessel lesion at the takeoff of a large first diagonal branch.  Distal vessel had 40% multiple discrete lesions.  The first diagonal branch has a 90% ostial and a 40% distal lesion.  Circumflex coronary artery was nondominant.  It was subtotally occluded in the mid vessel with TIMI-2 flow to a medium sized first obtuse marginal branch with a 90% ostial  lesion.  There were left to right collaterals to the right coronary artery.  The right coronary artery was chronically occluded just after the first right ventricular branch.  There were both right to right and left to right collaterals to the PDA and posterolateral branches.  Both the circumflex and right coronary artery occlusions appeared chronic.  RIGHT ANTERIOR OBLIQUE VENTRICULOGRAPHY:  RAO ventriculography revealed inferolateral inferior wall akinesis.  Ejection fraction was in the 35-40% range.  There was mild angiographic MR.  LV pressure was 138 with a post A wave EDP of 30.  There is no gradient across the aortic valve.  IMPRESSION:  The patient will need coronary artery bypass grafting.  He has severe three-vessel disease with chronically occluded right coronary artery and mid circumflex.  I suspect he became ischemic postoperative with his elevated blood pressure, heart rate and dropping hemoglobin.  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 His inferior wall was thin and I suspect his slight ST elevation on his ECG represented this previous infarction.  If his enzymes bump I suspect there will only be a mild subendocardial increase with this episode.  The patient is a 3 to 4-pack-year smoker and desperately needs to stop this. Timing of CABG will depend on what urology says in regards to his bleeding risks.  We will hopefully be able to start heparin in the next 12-24 hours, and will have CVTS see the patient for possible CABG next week. Dictated by:  [name]. Eden Emms, M.D. [facility] Attending Physician:  Colon Branch DD:  02/02/01 TD:  02/03/01 Job: [id number]

## 2010-10-30 NOTE — Consult Note (Signed)
[place]. [facility]  Patient:    Andre Cook, Andre Cook Visit Number: [id number] MRN: [id number]          Service Type: MED Location: MICU 2112 01 Attending Physician:  Colon Branch Dictated by:   Salvatore Decent. Cornelius Moras, M.D. Proc. Date: 02/02/01 Adm. Date:  02/02/2001   CC:         [name]. Eden Emms, M.D. [facility]  Barron Alvine, M.D.  [facility] on [address]   Consultation Report  REFERRING PHYSICIAN:  [name]. Eden Emms, M.D.  PRIMARY ATTENDING PHYSICIAN:  Barron Alvine, M.D.  REASON FOR CONSULTATION:  Severe three-vessel coronary artery disease.  HISTORY OF PRESENT ILLNESS:  The patient is a 65 year old white male with no documented previous history of coronary artery disease but risk factors including history of hypertension, hyperlipidemia, longstanding severe tobacco abuse, and a strong family history of coronary artery disease. The patient describes a three- to four-year history of symptoms that are consistent with angina. He describes a pressure-like pain in his upper chest and neck which is typically exacerbated by physical activity but occasionally also develops at rest. He has remained quite active physically and has not been limited by these symptoms, and he has not ever sought medical attention. He recently was noted to have gross hematuria for which he was evaluated at the urgent medical care facility on [address] He was referred to Barron Alvine, M.D. Abdominal CT scan documented the presence of a small solid nodule in the right kidney. He was brought in for elective right partial nephrectomy through right flank exploration. This was performed on August 21. The procedure was technically uncomplicated and straightforward. A 2 cm mass in the right kidney was resected, and pathology was found to be consistent with renal cell carcinoma. Surgical margins were reportedly negative. The patient initially did well. He did develop mild  postoperative anemia on postoperative day #1 with a hemoglobin value of 8.8 which had decreased from 15.4 preoperatively. He also developed hypertension and sudden onset of chest and neck pain on August 22. EKG was obtained and was noted to have acute ST segment changes consistent with inferior ischemia. He was taken directly for cardiac catheterization by [name] C. Eden Emms, M.D. This demonstrates severe three-vessel coronary artery disease with moderate left ventricular dysfunction. Cardiac surgical consultation was requested.  REVIEW OF SYSTEMS:  Notable for the following categories and associated pertinent data:  GENERAL:  The patient overall has been well and reports good appetite. However, he does note a 25-pound weight loss over the last two years. He is uncertain for his reason. He states that he eats well. RESPIRATORY:  The patient has dry, intermittent cough. He denies any history of productive cough or wheezing preoperatively. GASTROINTESTINAL:  Notable for some occasional bouts of diarrhea. The patient denies any history of constipation. He reports no problems with hematochezia, hematemesis, or melena. CARDIAC:  Notable for chest pain which the patient describes as a pressure-like sensation across the upper chest radiating to the neck. His episode earlier today lasted approximately 10 to 15 minutes. This is the most severe episode that he has had, although he has had similar episodes for at least three to four years. He states that typically these episodes of chest pain are relieved by laying and resting and remaining quiet for an extended period of time. He has not previously sought medical attention for this condition. The episodes are associated with mild shortness of breath. He has mild stable exertional shortness of  breath otherwise. He denies any problems with resting shortness of breath, PND, orthopnea, palpitations, or syncope. He does have mild chronic bilateral lower  extremity edema. NEUROLOGICAL:  Notable for history of chronic numbness of the right foot. He is unclear of the reason for this. He also apparently was evaluated two years ago for a history of elevated intraocular pressure. He was seen by an ophthalmologist and noted to have findings felt consistent with increased intracranial pressure. He underwent a brain CT at that time which apparently was unremarkable. He states that he supposed to have a lumbar puncture performed, but this was never done and has not been followed up since. MUSCULOSKELETAL:  Negative. GENITOURINARY: Positive and notable for a history of gross hematuria which led to this recent evaluation. 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 INFECTIOUS:  Notable for recurrent epididymidis as described. The patient denies any fevers or chills. HEMATOLOGIC:  Negative. ENDOCRINE: Negative. PSYCHIATRIC:  Negative. PERIPHERAL VASCULAR:  Negative. The patient denies any symptoms of claudication.  PAST MEDICAL HISTORY:  Notable for hypertension and hyperlipidemia. The patient denies any known history of diabetes. He has not been previously told that he has a diagnosis of emphysema or COPD.  SOCIAL HISTORY:  The patient lives alone and worked for the [employer]. He smokes between three and four packs of cigarettes per day. He states that he has a history of significant alcohol consumption in the remote past but that he has not drunk significantly in many years.  MEDICATIONS PRIOR TO ADMISSION:  Norvasc and hydrochlorothiazide.  ALLERGIES:  The patient denies any known drug allergies or sensitivities.  PHYSICAL EXAMINATION:  GENERAL:  Well-developed, well-nourished, white male who is in no acute distress.  VITAL SIGNS:  At the time of his initial examination, he was found to be in  sinus rhythm by monitor with sinus tachycardia, heart rate of 110, and blood pressure 170/90,  respirations are 20 and unlabored, oxygen saturation 100% on 2 L nasal cannula.  HEENT:  Essentially within normal limits.  NECK:  Supple. There is no cervical or supraclavicular lymphadenopathy. There is no jugular venous distention. No carotid bruits are noted.  CHEST:  Auscultation of the chest reveals bilateral expiratory wheezes and rhonchi. No rales are noted. Breath sounds are symmetrical.  CARDIOVASCULAR:  Demonstrates regular rate and rhythm. No murmurs, rubs, or gallops are noted.  ABDOMEN:  Soft. There is mild tenderness along the right flank associated with his recent surgical incision. There is otherwise no abdominal distention or other tenderness. No masses are identified.  EXTREMITIES:  Warm and well perfused. There is trace bilateral lower extremity edema. Distal pulses are palpable in both lower legs at the ankle. The  NEUROLOGICAL:  Grossly nonfocal. The patient does describe some decreased sensation in the right foot.  RECTAL:  Deferred.  GENITOURINARY:  Deferred.  The remainder of his physical exam is noncontributory.  LABORATORY DATA:  Notable for anemia with hemoglobin of 8.8 and hematocrit of 25%. The patients platelet count is 298,000. This is down from a baseline hemoglobin of 15.4 prior to his surgery. He has normal serum electrolytes and a serum creatinine of 1.  Previous chest x-ray report is reviewed and the film will subsequently be reviewed. This demonstrates changes felt to be consistent with chronic bronchitis.  Electrocardiogram currently demonstrates normal sinus rhythm with LVH and possible previous inferior myocardial infarction.  Cardiac catheterization:  Catheterization obtained by [name] C. Eden Emms, M.D. on August 22  is reviewed. This demonstrate severe three-vessel coronary artery disease including 80 to 90% proximal stenosis of the left anterior descending coronary artery arising at takeoff of a large diagonal branch. There is  100% occlusion of the proximal left circumflex coronary artery with very sluggish flow into a first circumflex marginal branch which appears to be relatively small to medium-sized vessel. There is relatively poor collateral filling of other branches on the distal portion of the lateral wall of the left ventricle. There is 100% occlusion of the proximal right coronary artery. This also may be a subacute or acute lesion, although it could be chronic. There is relatively poor collateral filling of the distal right coronary circulation. Overall left ventricular function appears moderately reduced with ejection fraction estimated at 30 to 35%. There is inferior wall akinesis. There is hypokinesis of the global left ventricle and apical hypokinesis. There is no mitral regurgitation.  IMPRESSION:  A 65 year old white male who underwent partial nephrectomy yesterday and suffered acute onset of chest pain consistent with unstable angina or possible acute evolving perioperative myocardial infarction. Ultimately, he would likely benefit from elective coronary artery bypass grafting. He will need to be stabilized medically and allowed to recover from his recent surgery. 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 He has newly diagnosed renal cell carcinoma. He has alleged history of increased intracranial pressure which may need to be evaluated. He probably has COPD. We will continue to follow along and make a decision about the timing of possible elective coronary revascularization at a later date. Dictated by:   Salvatore Decent Cornelius Moras, M.D. Attending Physician:  Colon Branch DD:  02/03/01 TD:  02/04/01 Job: [id number]

## 2010-10-30 NOTE — Discharge Summary (Signed)
[place]. [facility]  Patient:    Andre Cook, Andre Cook Visit Number: [id number] MRN: [id number]          Service Type: MED Location: 2000 2019 01 Attending Physician:  Purcell Nails Md Dictated by:   Adair Patter, P.A. Admit Date:  02/02/2001 Discharge Date: 02/19/2001   CC:         [name]. Eden Emms, M.D. [facility]  Barron Alvine, M.D.   Discharge Summary  DATE OF BIRTH:  12-29-1945  ADMITTING DIAGNOSIS:  Right renal mass.  SECONDARY DIAGNOSES: 1. Coronary artery disease. 2. Hyperlipidemia.  DISCHARGE DIAGNOSIS:  Coronary artery disease.  HOSPITAL PROCEDURES: 1. Right partial nephrectomy. 2. Cardiac catheterization. 3. Pre-CABG Dopplers. 4. Bilateral carotid duplex. 5. Coronary artery bypass graft x 4.  HOSPITAL COURSE:  The patient was admitted to [facility] on February 01, 2001 secondary to undergoing a right partial nephrectomy.  He underwent this by experiencing gross hematuria.  He had a CAT scan done, which showed right renal mass suspicious for cancer.  The patient underwent a right partial nephrectomy by Dr. Isabel Caprice on February 01, 2001.  Postoperatively, after the nephrectomy, the patient had a hospital course complicated by shortness of breath and substernal chest pain.  Because of this, cardiology consultation was obtained.  The patient underwent cardiac workup including cardiac catheterization.  This revealed the patient had significant coronary artery disease.  Because of this, Dr. Cornelius Moras was consulted.  It was felt that coronary artery bypass grafting was appropriate course of action.  However, this was delayed in order to allow patient to adequately recover from his right partial nephrectomy.  Eventually, the patient underwent a coronary artery bypass graft x 4 on February 15, 2001.  The following grafts were used: The left internal mammary artery to the left anterior descending artery, saphenous vein graft to the posterior  descending arteries, saphenous vein graft to the circumflex artery and saphenous vein graft to the diagonal artery.  This was done by Dr. Cornelius Moras.  No complications were noted. Postoperatively, the patient had a hospital course complicated by some moderate hypertension.  This was treated by just the patients hypertensive medications.  The remainder of this hospital course remained uneventful and he recovered adequately from his cardiac surgery.  He is subsequently discharged home in stable condition on February 19, 2001.  MEDICATIONS AT THE TIME OF DISCHARGE: 1. Tylox 1-2 tablets every 4-6 hours as needed for pain. 2. Lopressor 50 mg one tablet every 12 hours. 3. Aspirin 325 mg one tablet daily.  DISCHARGE ACTIVITY:  Patient told to avoid driving, strenuous activity, lifting of heavy objects of over 10 pounds.  DISCHARGE DIET:  Low-fat, low-salt.  WOUND CARE:  Patient was told he could shower and clean his incision with soap and water.  DISPOSITION:  Home.  FOLLOWUP:  The patient was told to follow up with his urology, Dr. Isabel Caprice as directed by him.  He was told to call his cardiologist, Dr. Ricki Miller office for an appointment in two weeks.  He was also told his appointment with Dr. Cornelius Moras would be in approximately three weeks and that our office will call him to verify the time and date of that appointment. Dictated by:   Adair Patter, P.A. Attending Physician:  Purcell Nails Md DD:  02/18/01 TD:  02/19/01 Job: [id number]

## 2010-10-30 NOTE — H&P (Signed)
[facility]  Patient:    Andre Cook, Andre Cook Visit Number: [id number] MRN: [id number]          Service Type: SUR Location: 1S X009 01 Attending Physician:  Thermon Leyland Dictated by:   Barron Alvine, M.D. Adm. Date:  02/01/2001                           History and Physical  REASON FOR ADMISSION:  Postoperative care, status post right flank exploration and partial nephrectomy.  HISTORY OF PRESENT ILLNESS:  Andre Cook is a 65 year old male.  He has [facility] under the care of [facility] over the last 12 to 18 months.  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  The patient also has been known to have some stones in the past.  Approximately two months ago he also had some gross hematuria.  As part of that evaluation, the patient had a CT scan performed.  This revealed a 1.5 cm enhancing solid lesion in the right kidney.  This was felt to be potentially suspicious for renal cell carcinoma.  We explained to the patient some of the pros and cons of biopsy, but recommended that given the suspicion of this lesion that the safest and most definitive way to assess this was to do a flank exploration with excision of this abnormality.  He was told that if this was a renal cell carcinoma we would have an extremely high likelihood of cure given the small size of the lesion.  He understood the advantages and disadvantages of this approach.  Full informed consent was obtained to proceed.  He has had no other complaints.  His testicular pain has resolved.  PAST MEDICAL HISTORY:  Relatively unremarkable.  The patient does have some hypertension for which he takes Norvasc and hydrochlorothiazide.  He again has had a history of stones, but none recently, and recent imaging has not shown anything.  ALLERGIES:  No known drug allergies.  SOCIAL HISTORY:  He has approximately 100 pack smoking history.  FAMILY HISTORY:   Notable for hypertension.  REVIEW OF SYSTEMS:  Really otherwise negative.  PHYSICAL EXAMINATION:  GENERAL:  He is a well-developed, well-nourished male.  VITAL SIGNS:  Blood pressure 190/90, pulse 88, respiratory rate 16, he is afebrile.  His current weight is 181 pounds, and he is 6 feet 2 inches tall.  NECK:  No JVD or masses.  CHEST:  Clear to auscultation.  ABDOMEN:  Soft and nontender without obvious masses or hepatosplenomegaly.  GENITALIA:  External genitalia shows normal external genitalia, unremarkable scrotum and testes.  EXTREMITIES:  Without edema.  LABORATORY DATA:  Hemoglobin is normal at 15.  Renal function is also normal. His preoperative labs are otherwise unremarkable.  ASSESSMENT:  A small right renal mass.  PLAN:  The patient will be admitted for routine postoperative care status post flank exploration with probable right partial nephrectomy done today. Dictated by:   Barron Alvine, M.D. Attending Physician:  Thermon Leyland DD:  02/01/01 TD:  02/01/01 Job: [id number]

## 2010-10-30 NOTE — Op Note (Signed)
[facility]  Patient:    Andre Cook, Andre Cook Visit Number: [id number] MRN: [id number]          Service Type: SUR Location: 3E 0345 02 Attending Physician:  Thermon Leyland Proc. Date: 02/01/01 Adm. Date:  02/01/2001   CC:         Aura Dials, M.D.   Operative Report  PREOPERATIVE DIAGNOSES: 1. Right renal mass. 2. Gross hematuria.  POSTOPERATIVE DIAGNOSES: 1. Right renal mass. 2. Gross hematuria.  PROCEDURE:  Right partial nephrectomy and flexible cystoscopy.  SURGEON:  Barron Alvine, M.D.  ASSISTANT:  Bertram Millard. Dahlstedt, M.D.  ANESTHESIA:  General endotracheal.  INDICATIONS FOR PROCEDURE:  Andre Cook is a 65 year old male. He has been cared for and evaluated for some chronic abdominal testicular pain. He also had a history of gross hematuria. As part of that evaluation, he underwent upper tract imaging which included abdominal and pelvic CT scan. This showed an enhancing solid lesion measuring approximately 1.5 to 2 cm in the right kidney. This was suspicious for renal cell carcinoma. The patient underwent extensive counselling with regard to management options. This included observation with serial CT images versus biopsy versus open exploration and removal. We recommended the latter procedure and the patient agreed to undergo this procedure. Full informed consent was obtained.  TECHNIQUE AND FINDINGS:  The patient was brought to the operating room where he had successful induction of general anesthesia. He was then carefully placed in the standard flank position. An axillary roll was utilized. Compression boots were used and all extremities were carefully padded. The patient was put in flex with the kidney rest. He was then prepped and draped in the normal manner. A Foley catheter had been placed prior to the positioning. A standard flank incision was performed off the tip of the twelfth rib. The rib was not taken. The  retroperitoneal space was entered and the kidney was easily palpable. We stayed out of the intraperitoneal cavity. The tumor was palpable although given its small size we could not be certain. Based on the CT imaging, we were able to locate the area where the tumor should be and could certainly feel what felt like a 1.5 to 2 cm mass underneath the fat. Gerotas fascia was then opened. The area that was suspicious was cleared of fat with care taken to leave some of the fat overlying the actual tumor. As we approached the suspicious area, one could see a margin then of tumor. It did indeed appear to be a 1/2 to 2 cm solid lesion. We felt excision of this was indicated. Given the small size of the lesion, we did not feel that vascular control was necessary. The capsule was scored with electrocautery and then utilizing the back of a knife handle, the tumor was removed. Lateral margins as well as posterior margins were taken and no tumor was seen. I did not do a frozen section on the tumor itself but certainly upon looking at this, it appeared to be a solid tumor consistent with a renal cell carcinoma. The argon bean photo coagulator was used. We then used a piece of Gelfoam soaked with fibrin glue for a hemostatic plug. The Gerotas fascia with perinephric fat was then reapproximated over the kidney. There was no evidence of any collecting system injury. Blood loss was relatively minimal at less than 200 cc. The patient tolerated the procedure well. Sponge and needle counts were correct. The actual wound itself was not drained. Closure was done  with multiple layers of #1 PDS, three layers anteriorly and two posteriorly. Skin clips were used for the skin. He was brought to the recovery room in stable condition. Attending Physician:  Thermon Leyland DD:  02/01/01 TD:  02/01/01 Job: [id number]

## 2010-10-30 NOTE — Op Note (Signed)
[place]  Patient:    FARES, RAMTHUN Visit Number: [id number] MRN: [id number]          Service Type: MED Location: [id number] [phone] Attending Physician:  Tressie Stalker Dictated by:   Salvatore Decent. Cornelius Moras, M.D. Proc. Date: 02/15/01 Admit Date:  02/02/2001   CC:         [name] C. Eden Emms, M.D. [facility]  [facility] Alvine, M.D.  CVTS ofc   Operative Report  PREOPERATIVE DIAGNOSIS:  Severe three-vessel coronary artery disease status post acute myocardial infarction.  POSTOPERATIVE DIAGNOSIS:  Severe three-vessel coronary artery disease status post acute myocardial infarction.  OPERATION PERFORMED:  Median sternotomy for coronary artery bypass grafting x 4 (left internal mammary artery to distal left anterior descending coronary artery, saphenous vein graft to first diagonal branch, saphenous vein graft to circumflex marginal branch, saphenous vein graft to posterior descending coronary artery).  SURGEON:  Salvatore Decent. Cornelius Moras, M.D.  ASSISTANT:  Dominica Severin, P.A.  ANESTHESIA:  General.  INDICATIONS FOR PROCEDURE:  The patient is a 65 year old white male from [place] with history of hypertension, hyperlipidemia, tobacco abuse and a strong family history of coronary artery disease.  The patient underwent right partial nephrectomy on August 21 by Dr. Barron Alvine for stage 1 renal cell carcinoma.  The patient suffered a perioperative myocardial infarction and was evaluated by Dr. Charlton Haws.  He was taken for cardiac catheterization which demonstrated severe three-vessel coronary artery disease with moderate left ventricular dysfunction.  Previous consultation note has been dictated.  The patient remained stable clinically and was allowed to recover from his partial nephrectomy.  He was now brought for elective surgical revascularization.  OPERATIVE CONSENT:  The patient and his sister have been counseled at length regarding the indications and  potential benefits of coronary artery bypass grafting.  They understand the associated risks of surgery including but not limited to the risks of death, stroke, myocardial infarction, bleeding requiring blood transfusion, arrhythmia, infection, and recurrent coronary artery disease.  They understand and accept these risks as well as any unforeseen complication and desire to proceed with the surgery as described.  DESCRIPTION OF PROCEDURE:  The patient was brought to the operating room on the above-mentioned date and placed in the supine position on the operating table.  Central invasive monitoring catheters were placed by the  anesthesia service under the care and direction of Dr. Krista Blue.  Intravenous antibiotics were administered.  Following induction of general endotracheal anesthesia, the patients chest, abdomen, both groins, and both lower extremities were prepared and draped in a sterile manner.  A median sternotomy incision was performed and the left internal mammary artery was dissected from the chest wall and prepared for bypass grafting. The left internal mammary artery was felt to be good quality conduit. Simultaneously saphenous vein was obtained from the patients right lower extremity through a longitudinal incision.  The saphenous vein was felt to be good quality conduit.  The patient was heparinized systemically.  The pericardium was opened. The ascending aorta was normal in appearance.  The ascending aorta and right atrium were cannulated for cardiopulmonary bypass.  Adequate heparinization was verified.  Cardiopulmonary bypass was begun and the surface of the heart was inspected. There was diffuse scarring within the inferior and lateral wall of the left ventricle diffusely suggestive of previous myocardial infarctions in the past. Distal sites were selected for coronary artery bypass grafting.  Portions of saphenous vein and the left internal mammary artery were all  trimmed  to appropriate lengths.  A temperature probe was placed in the left ventricular septum and a styrofoam pad was placed to protect the left phrenic nerve from thermal injury.  A cardioplegia catheter was placed in the ascending aorta.  The patient was cooled to 32 degrees systemic temperature.  The aortic crossclamp was applied and cardioplegia was delivered in antegrade fashion through the aortic root.  Iced saline slush was applied for topical hypothermia.  The initial cardioplegic arrest and myocardial cooling were felt to be excellent.  Additional doses of cardioplegia were administered intermittently throughout the crossclamp portion of the operation both through the aortic root and down subsequently placed vein grafts to maintain septal temperature below 15 degrees centigrade.  The following distal coronary anastomoses were performed:  (1) The posterior descending coronary artery was grafted with saphenous vein graft in end-to-side fashion.  This coronary measured 1.4 mm in diameter and is of fair quality.  (2) Circumflex marginal branch was grafted with a saphenous vein graft in end-to-side fashion.  This coronary measured 1.5 mm and was of fair quality.  (3) The diagonal branch off the left anterior descending coronary artery was grafted with a saphenous vein graft in end-to-side fashion.  This coronary measured 1.5 mm in diameter and was of good quality at the site of distal bypass.  (4) The distal left anterior descending coronary artery was grafted with left internal mammary artery in an end-to-side fashion.  This coronary measured 1.5 mm at the site of the distal bypass and is of fair to good quality.  It is intramyocardial proximal to this level.  All three proximal anastomoses were performed directly to the ascending aorta prior to removal of the aortic crossclamp.  The septal temperature was noted to rise rapidly and  dramatically upon reperfusion of the left internal  mammary artery.  The aortic crossclamp was removed after a total crossclamp time of 76 minutes.  The heart was defibrillated and normal sinus rhythm returned.  The patient was rewarmed to greater than 37 degrees centigrade temperature.  All proximal and distal anastomoses are inspected for hemostasis and appropriate graft orientation.  Epicardial pacing wires were fixed to the right ventricular outflow tract and to the right atrial appendage.  The patient was weaned from cardiopulmonary bypass without difficulty.  The patients rhythm at separation from bypass was normal sinus rhythm.  No inotropic support was required. Total cardiopulmonary bypass time for the operation was 101 minutes.  The venous and arterial cannulae were removed uneventfully.  Protamine was administered to reverse the anticoagulation.  The mediastinum and the left chest were irrigated with saline solution containing vancomycin.  Meticulous surgical hemostasis was ascertained.  The mediastinum and the left chest were drained with three chest tubes placed through separate stab incisions inferiorly.  The median sternotomy was closed in a routine fashion.  The right lower extremity incisions were closed in multiple layers in routine fashion.  All skin incisions were closed with subcuticular skin closures.  The patient tolerated the procedure well and was transported to the surgical intensive care unit in stable condition.  There were no intraoperative complications.  All sponge, needle and instrument counts were verified correct at the completion of the operation.  No blood products were administered. Dictated by:   Salvatore Decent Cornelius Moras, M.D. Attending Physician:  Tressie Stalker DD:  02/15/01 TD:  02/15/01 Job: [id number]

## 2010-11-13 LAB — LIPID PANEL
HDL: 31 mg/dL — AB (ref 35–70)
Triglycerides: 428 mg/dL — AB (ref 40–160)

## 2011-06-29 ENCOUNTER — Ambulatory Visit (INDEPENDENT_AMBULATORY_CARE_PROVIDER_SITE_OTHER): Payer: No Typology Code available for payment source | Admitting: Family Medicine

## 2011-06-29 DIAGNOSIS — E669 Obesity, unspecified: Secondary | ICD-10-CM

## 2011-06-29 DIAGNOSIS — IMO0001 Reserved for inherently not codable concepts without codable children: Secondary | ICD-10-CM

## 2011-06-29 DIAGNOSIS — E782 Mixed hyperlipidemia: Secondary | ICD-10-CM

## 2011-07-02 ENCOUNTER — Encounter: Payer: Self-pay | Admitting: Family Medicine

## 2011-07-02 DIAGNOSIS — E039 Hypothyroidism, unspecified: Secondary | ICD-10-CM

## 2011-07-02 DIAGNOSIS — E890 Postprocedural hypothyroidism: Secondary | ICD-10-CM | POA: Insufficient documentation

## 2011-07-02 DIAGNOSIS — I2510 Atherosclerotic heart disease of native coronary artery without angina pectoris: Secondary | ICD-10-CM

## 2011-07-02 DIAGNOSIS — E669 Obesity, unspecified: Secondary | ICD-10-CM

## 2011-07-02 DIAGNOSIS — I10 Essential (primary) hypertension: Secondary | ICD-10-CM

## 2011-07-02 DIAGNOSIS — E7800 Pure hypercholesterolemia, unspecified: Secondary | ICD-10-CM

## 2011-10-29 ENCOUNTER — Encounter: Payer: Self-pay | Admitting: Family Medicine

## 2011-10-29 ENCOUNTER — Ambulatory Visit: Payer: No Typology Code available for payment source | Admitting: Family Medicine

## 2011-10-29 ENCOUNTER — Ambulatory Visit (INDEPENDENT_AMBULATORY_CARE_PROVIDER_SITE_OTHER): Payer: No Typology Code available for payment source | Admitting: Family Medicine

## 2011-10-29 VITALS — BP 121/77 | HR 85 | Temp 96.4°F | Resp 16 | Ht 71.5 in | Wt 243.2 lb

## 2011-10-29 DIAGNOSIS — I10 Essential (primary) hypertension: Secondary | ICD-10-CM

## 2011-10-29 DIAGNOSIS — E039 Hypothyroidism, unspecified: Secondary | ICD-10-CM

## 2011-10-29 DIAGNOSIS — E785 Hyperlipidemia, unspecified: Secondary | ICD-10-CM

## 2011-10-29 DIAGNOSIS — Z0000 Encounter for general adult medical examination without abnormal findings: Secondary | ICD-10-CM

## 2011-10-29 DIAGNOSIS — N400 Enlarged prostate without lower urinary tract symptoms: Secondary | ICD-10-CM

## 2011-10-29 LAB — IFOBT (OCCULT BLOOD): IFOBT: NEGATIVE

## 2011-10-29 MED ORDER — RAMIPRIL 10 MG PO TABS: 10.0000 mg | ORAL_TABLET | Freq: Two times a day (BID) | ORAL | Status: DC

## 2011-10-29 MED ORDER — OMEPRAZOLE 20 MG PO CPDR: 20.0000 mg | DELAYED_RELEASE_CAPSULE | Freq: Every day | ORAL | Status: DC

## 2011-10-29 MED ORDER — LEVOTHYROXINE SODIUM 112 MCG PO TABS: 112.0000 ug | ORAL_TABLET | Freq: Every day | ORAL | Status: DC

## 2011-10-29 MED ORDER — HYDROCHLOROTHIAZIDE 25 MG PO TABS: 25.0000 mg | ORAL_TABLET | Freq: Every day | ORAL | Status: DC

## 2011-10-29 MED ORDER — METOPROLOL TARTRATE 50 MG PO TABS: 50.0000 mg | ORAL_TABLET | Freq: Two times a day (BID) | ORAL | Status: DC

## 2011-10-29 MED ORDER — ROSUVASTATIN CALCIUM 20 MG PO TABS: 20.0000 mg | ORAL_TABLET | Freq: Every day | ORAL | Status: DC

## 2011-10-29 MED ORDER — AMLODIPINE BESYLATE 10 MG PO TABS: 10.0000 mg | ORAL_TABLET | Freq: Every day | ORAL | Status: DC

## 2011-10-29 NOTE — Progress Notes (Signed)
  Subjective:    Patient ID: Andre Cook, male    DOB: January 04, 1946, 66 y.o.   MRN: [id number]  HPI This 66 y.o. Cauc male is here for CPE; he had a physical early in 2012 at [facility]. There he had   ECG done.He has a hx of severe 3 -vessel CAD and is S/P CABG; Dr, Melburn Popper is his cardiologist (last visit  more than one year ago). He has managed to lose 12 pounds since January 2013. He is a nonsmoker  and does not consume alcohol. He is retired and exercises at a local fitness center.   Last eye exam: 2013  Last dental exam: 2013  Last Colonoscopy:04/26/2006 (normal)- Dr. Arlyce Dice    Review of Systems  Constitutional: Positive for appetite change. Negative for fever and fatigue.       Weight loss= 12 lbs  HENT: Negative.        Dry mouth  Respiratory: Negative.   Cardiovascular: Negative.   Gastrointestinal: Negative.   Genitourinary: Negative for dysuria, frequency, difficulty urinating, penile pain and testicular pain.       Admits some mild symptoms of prostate enlargement  Musculoskeletal: Negative.   Skin: Negative.   Neurological: Negative.   Hematological: Negative.   Psychiatric/Behavioral: Negative.        Objective:   Physical Exam  Nursing note and vitals reviewed. Constitutional: He is oriented to person, place, and time. He appears well-developed and well-nourished. No distress.  HENT:  Head: Normocephalic and atraumatic.  Right Ear: External ear normal.  Left Ear: External ear normal.  Nose: Nose normal.  Mouth/Throat: Oropharynx is clear and moist.  Eyes: Conjunctivae and EOM are normal. Pupils are equal, round, and reactive to light. No scleral icterus.  Neck: Normal range of motion. Neck supple. No JVD present. No thyromegaly present.  Cardiovascular: Normal rate, regular rhythm, normal heart sounds and intact distal pulses.  Exam reveals no gallop and no friction rub.   No murmur heard. Pulmonary/Chest: Effort normal and breath sounds normal. No  respiratory distress. He has no wheezes. He has no rales.  Abdominal: Soft. Bowel sounds are normal. He exhibits no mass. There is no tenderness. There is no guarding.       No Hepatosplenomegaly  Genitourinary: Rectum normal and penis normal. Guaiac negative stool.       Prostate slightly enlarged and firm with some asymmetry  Musculoskeletal: Normal range of motion. He exhibits no edema and no tenderness.  Lymphadenopathy:    He has no cervical adenopathy.  Neurological: He is alert and oriented to person, place, and time. He has normal reflexes. No cranial nerve deficit. He exhibits normal muscle tone. Coordination normal.  Skin: Skin is warm and dry. No rash noted. No pallor.  Psychiatric: He has a normal mood and affect. His behavior is normal. Judgment and thought content normal.          Assessment & Plan:   1. Routine general medical examination at a health care facility  IFOBT POC (occult bld, rslt in office)  2. Hyperlipidemia  Lipid panel, Comprehensive metabolic panel  3. BPH (benign prostatic hypertrophy)  PSA  4. Hypothyroidism  TSH  5. HTN (hypertension)  Comprehensive metabolic panel

## 2011-10-29 NOTE — Patient Instructions (Signed)
Benign Prostatic Hypertrophy  The prostate gland is part of the reproductive system of men. A normal prostate is about the size and shape of a walnut. The prostate gland makes a fluid that is mixed with sperm to make semen. This gland surrounds the urethra and is located in front of the rectum and just below the bladder. The bladder is where urine is stored. The urethra is the tube through which urine passes from the bladder to get out of the body. The prostate grows as a man ages. An enlarged prostate not caused by cancer is called benign prostatic hypertrophy (BPH). This is a common health problem in men over age 64. This condition is a normal part of aging. An enlarged prostate presses on the urethra. This makes it harder to pass urine. In the early stages of enlargement, the bladder can get by with a narrowed urethra by forcing the urine through. If the problem gets worse, medical or surgical treatment may be required.  This condition should be followed by your caregiver. Longstanding back pressure on the kidneys can cause infection. Back pressure and infection can progress to bladder damage and kidney (renal) failure. If needed, your caregiver may refer you to a specialist in kidney and prostate disease (urologist). CAUSES  The exact cause is not known.  SYMPTOMS   You are not able to completely empty your bladder.   Getting up often during the night to urinate.   Need to urinate frequently during the day.   Difficultly in starting urine flow.   Decrease in size and strength of the urine stream.   Dribbling after urination.   Pain on urination (more common with infection).   Inability to pass your water. This needs immediate treatment.  DIAGNOSIS  These tests will help your caregiver understand your problem:  Digital rectal exam (DRE). In a rectal exam, your caregiver checks your prostate by putting a gloved, lubricated finger into the rectum to feel the back of your prostate gland.  This exam detects the size of the gland and abnormal lumps or growths.   Urinalysis (exam of the urine). This may include a culture if there is concern about infection.   Prostate Specific Antigen (PSA). This is a blood test used to screen for prostate cancer. It is not used alone for diagnosing prostate cancer.   Rectal ultrasound (sonogram). This test uses sound waves to electronically produce a "picture" of the prostate. It helps examine the prostate gland for cancer.  TREATMENT  Mild symptoms may not need treatment. Simple observation and yearly exams may be all that is required. Medications and surgery are options for more severe problems. Your caregiver can help you make an informed decision for what is best. Two classes of medications are available for relief of prostate symptoms:  Medications that shrink the prostate. This helps relieve symptoms.   Uncommon side effects include problems with sexual function.   Medications to relax the muscle of the prostate. This also relieves the obstruction.   Side effects can include dizziness, fatigue, lightheadedness, and retrograde ejaculation (diminished volume of ejaculate).  Several types of surgical treatments are available for relief of prostate symptoms:  Transurethral resection of the prostate (TURP). In this treatment, an instrument is inserted through opening at the tip of the penis. It is used to cut away pieces of the inner core of the prostate. The pieces are removed through the same opening of the penis. This removes the obstruction and helps get rid of the  symptoms.   Transurethral incision (TUIP). In this procedure, small cuts are made in the prostate. This lessens the prostates pressure on the urethra.   Transurethral microwave thermotherapy (TUMT). This procedure uses microwaves to create heat. The heat destroys and removes a small amount of prostate tissue.   Transurethral needle ablation (TUNA). This is a procedure that uses  radio frequencies to do the same as TUMT.   Interstitial laser coagulation (ILC). This is a procedure that uses a laser to do the same as TUMT and TUNA.   Transurethral electrovaporization (TUVP). This is a procedure that uses electrodes to do the same as the procedures listed above.  Regardless of the method of treatment chosen, you and your caregiver will discuss the options. With this knowledge, you along with your caregiver can decide upon the best treatment for you. SEEK MEDICAL CARE IF:   You develop chills, fever of 100.5 F (38.1 C), or night sweats.   There is unexplained back pain.   Symptoms are not helped by medications prescribed.   You develop medication side effects.   Your urine becomes very dark or has a bad smell.  SEEK IMMEDIATE MEDICAL CARE IF:   You are suddenly unable to urinate. This is an emergency. You should be seen immediately.   There are large amounts of blood or clots in the urine.   Your urinary problems become unmanageable.   You develop lightheadedness, severe dizziness, or you feel faint.   You develop moderate to severe low back or flank pain.   You develop chills or fever.  Document Released: 05/31/2005 Document Revised: 05/20/2011 Document Reviewed: 02/20/2007 [facility] Patient Information 2012 [place], [place]      .Keeping you healthy  Get these tests  Blood pressure- Have your blood pressure checked once a year by your healthcare provider.  Normal blood pressure is 120/80  Weight- Have your body mass index (BMI) calculated to screen for obesity.  BMI is a measure of body fat based on height and weight. You can also calculate your own BMI at [email or web address].  Cholesterol- Have your cholesterol checked every year.  Diabetes- Have your blood sugar checked regularly if you have high blood pressure, high cholesterol, have a family history of diabetes or if you are overweight.  Screening for Colon Cancer- Colonoscopy starting  at age 45.  Screening may begin sooner depending on your family history and other health conditions. Follow up colonoscopy as directed by your Gastroenterologist.  Screening for Prostate Cancer- Both blood work (PSA) and a rectal exam help screen for Prostate Cancer.  Screening begins at age 16 with African-American men and at age 25 with Caucasian men.  Screening may begin sooner depending on your family history.  Take these medicines  Aspirin- One aspirin daily can help prevent Heart disease and Stroke.  Flu shot- Every fall.  Tetanus- Every 10 years.  Zostavax- Once after the age of 48 to prevent Shingles.  Pneumonia shot- Once after the age of 88; if you are younger than 24, ask your healthcare provider if you need a Pneumonia shot.  Take these steps  Don't smoke- If you do smoke, talk to your doctor about quitting.  For tips on how to quit, go to www.smokefree.gov or call 1-800-QUIT-NOW.  Be physically active- Exercise 5 days a week for at least 30 minutes.  If you are not already physically active start slow and gradually work up to 30 minutes of moderate physical activity.  Examples of moderate activity include walking  briskly, mowing the yard, dancing, swimming, bicycling, etc.  Eat a healthy diet- Eat a variety of healthy food such as fruits, vegetables, low fat milk, low fat cheese, yogurt, lean meant, poultry, fish, beans, tofu, etc. For more information go to www.thenutritionsource.org  Drink alcohol in moderation- Limit alcohol intake to less than two drinks a day. Never drink and drive.  Dentist- Brush and floss twice daily; visit your dentist twice a year.  Depression- Your emotional health is as important as your physical health. If you're feeling down, or losing interest in things you would normally enjoy please talk to your healthcare provider.  Eye exam- Visit your eye doctor every year.  Safe sex- If you may be exposed to a sexually transmitted infection, use a  condom.  Seat belts- Seat belts can save your life; always wear one.  Smoke/Carbon Monoxide detectors- These detectors need to be installed on the appropriate level of your home.  Replace batteries at least once a year.  Skin cancer- When out in the sun, cover up and use sunscreen 15 SPF or higher.  Violence- If anyone is threatening you, please tell your healthcare provider.  Living Will/ Health care power of attorney- Speak with your healthcare provider and family.

## 2011-10-30 LAB — COMPREHENSIVE METABOLIC PANEL
ALT: 19 U/L (ref 0–53)
AST: 21 U/L (ref 0–37)
Alkaline Phosphatase: 67 U/L (ref 39–117)
BUN: 28 mg/dL — ABNORMAL HIGH (ref 6–23)
Glucose, Bld: 101 mg/dL — ABNORMAL HIGH (ref 70–99)
Total Bilirubin: 0.5 mg/dL (ref 0.3–1.2)
Total Protein: 7.3 g/dL (ref 6.0–8.3)

## 2011-10-30 LAB — LIPID PANEL
Cholesterol: 125 mg/dL (ref 0–200)
Total CHOL/HDL Ratio: 3.6 Ratio
VLDL: 30 mg/dL (ref 0–40)

## 2011-10-30 LAB — PSA: PSA: 1.87 ng/mL (ref <=4.00)

## 2011-11-03 ENCOUNTER — Encounter: Payer: Self-pay | Admitting: Family Medicine

## 2011-11-11 ENCOUNTER — Encounter: Payer: Self-pay | Admitting: Family Medicine

## 2012-02-24 ENCOUNTER — Encounter: Payer: Self-pay | Admitting: Family Medicine

## 2012-02-24 DIAGNOSIS — C649 Malignant neoplasm of unspecified kidney, except renal pelvis: Secondary | ICD-10-CM

## 2012-02-24 NOTE — Assessment & Plan Note (Signed)
Follow up with Dr. Barron Alvine at [facility] on 02/15/2012.  Results/Data: The following images/tracing/specimen were independently visualized: RUS: right kidney: 10.91 x 1.28 x 6.1 x 6.4 cm. Mid-pole: solid area w/shadowing: 1.64 x1.28 x1.7 cm. ? Attached to kidney. Left kidney: 11.96 x 1.38 x 5.59 x 5.8 cm. Appears normal. PVR: Ultrasound PVR 76 ml.  Plan: RTC in y yr for OV/RUS w/Dr. Isabel Caprice.

## 2012-07-17 ENCOUNTER — Ambulatory Visit (INDEPENDENT_AMBULATORY_CARE_PROVIDER_SITE_OTHER): Payer: No Typology Code available for payment source | Admitting: Family Medicine

## 2012-07-17 ENCOUNTER — Ambulatory Visit: Payer: No Typology Code available for payment source

## 2012-07-17 VITALS — BP 148/79 | HR 70 | Temp 97.6°F | Resp 17 | Ht 71.5 in | Wt 220.0 lb

## 2012-07-17 DIAGNOSIS — J069 Acute upper respiratory infection, unspecified: Secondary | ICD-10-CM

## 2012-07-17 MED ORDER — CEFDINIR 300 MG PO CAPS: 300.0000 mg | ORAL_CAPSULE | Freq: Two times a day (BID) | ORAL | Status: DC

## 2012-07-17 NOTE — Progress Notes (Signed)
[facility] [address] [place] [address] [phone]  Date:  07/17/2012   Name:  Andre Cook.   DOB:  08/26/1945   MRN:  [id number]  PCP:  Dow Adolph, MD    Chief Complaint: Cough and Otalgia   History of Present Illness:  Andre Schmutz. is a 67 y.o. very pleasant male patient who presents with the following:  He is here today with illness.  He has been ill for about one month- he has noted chest congestion, hearing muffled, runny nose.  He had noted slight ear pain.  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   He has not noted a fever.   His symptoms got worse a couple of days ago- increased lung congestion, more cough. He is not able to bring up phlegm, but he does feel that there is mucus in his chest.   He has also felt tired, but has not noted body aches.   He has not noted any GI symptoms    He does have a history of CABG, he is managed by [facility] and [facility].    Patient Active Problem List  Diagnosis  . CARCINOMA, RENAL CELL  . HYPOTHYROIDISM  . DYSLIPIDEMIA  . HYPERTENSION  . MYOCARDIAL INFARCTION  . EDEMA  . Hypothyroidism, postablative  . CAD (coronary artery disease)  . Obesity    Past Medical History  Diagnosis Date  . 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 Hypertension   . CAD (coronary artery disease) 2002    Post- op MI, CABG due to severe 3 vessel disease.  See cardiac cath report 02/02/2001  . Nephrolithiasis   . Dyslipidemia   . GERD (gastroesophageal reflux disease)   . Hyperglycemia   . Obesity   . MI (myocardial infarction) 2002    post- op 2002, severe 3 vessel disease, CABG.  Inferior wall MI  . 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 Carilyn Goodpasture  . Anxiety   . Heart disease   . Hypercholesterolemia   . Hypertrophy of prostate with urinary obstruction and other lower urinary tract symptoms (LUTS)     Past Surgical History  Procedure Date  .  Appendectomy   . Coronary artery bypass graft 2007    4 vessels treated  . Tonsillectomy and adenoidectomy   . Finger amputation     right 4th finger: industrial accident  . Partial nephrectomy 01/2001    Dr. Isabel Caprice, clear cell carcinoma  . Renal biopsy   . Cardiac surgery   . Thyroidectomy     History  Substance Use Topics  . Smoking status: Former Smoker    Types: Cigarettes    Quit date: 10/02/2010  . Smokeless tobacco: Not on file  . Alcohol Use: Not on file    Family History  Problem Relation Age of Onset  . Heart disease Father   . COPD Mother   . Cancer Sister     Primary-  Lung    Allergies  Allergen Reactions  . Ciprofloxacin Rash    Medication list has been reviewed and updated.  Current Outpatient Prescriptions on File Prior to Visit  Medication Sig Dispense Refill  . amLODipine (NORVASC) 10 MG tablet Take 1 tablet (10 mg total) by mouth daily.  90 tablet  3  . hydrochlorothiazide (HYDRODIURIL) 25 MG tablet Take 1 tablet (25 mg total) by mouth daily.  90 tablet  3  . levothyroxine (SYNTHROID, LEVOTHROID) 112 MCG tablet Take 1 tablet (112 mcg total) by mouth daily.  90 tablet  3  . metoprolol (LOPRESSOR) 50 MG tablet Take 1 tablet (50 mg total) by mouth 2 (two) times daily.  180 tablet  3  . omeprazole (PRILOSEC) 20 MG capsule Take 1 capsule (20 mg total) by mouth daily.  90 capsule  3  . ramipril (ALTACE) 10 MG tablet Take 1 tablet (10 mg total) by mouth 2 (two) times daily.  180 tablet  3  . rosuvastatin (CRESTOR) 20 MG tablet Take 1 tablet (20 mg total) by mouth daily.  90 tablet  3    Review of Systems:  As per HPI- otherwise negative.   Physical Examination: Filed Vitals:   07/17/12 0939  BP: 148/79  Pulse: 70  Temp: 97.6 F (36.4 C)  Resp: 17   Filed Vitals:   07/17/12 0939  Height: 5' 11.5" (1.816 m)  Weight: 220 lb (99.791 kg)   Body mass index is 30.26 kg/(m^2). Ideal Body Weight: Weight in (lb) to have BMI = 25: 181.4   GEN:  WDWN, NAD, Non-toxic, A & O x 3, overweight HEENT: Atraumatic, Normocephalic. Neck supple. No masses, No LAD.  Bilateral TM wnl, oropharynx normal.  PEERL,EOMI.   Ears and Nose: No external deformity. CV: RRR, No M/G/R. No JVD. No thrill. No extra heart sounds. PULM: CTA B, no wheezes.  No retractions. No resp. distress. No accessory muscle use. Diffuse ronchi bilaterally ABD: S, NT, ND, +BS. No rebound. No HSM. EXTR: No c/c/e NEURO Normal gait.  PSYCH: Normally interactive. Conversant. Not depressed or anxious appearing.  Calm demeanor.   [facility] reading (PRIMARY) by  Dr. Patsy Lager. CXR: NAD, history of sternotomy and mild thoracic spurring  Assessment and Plan: 1. Upper respiratory infection with cough and congestion  DG Chest 2 View, cefdinir (OMNICEF) 300 MG capsule   Treat for bronchitis with omnicef.  Close follow- up if not better- Sooner if worse.     Abbe Amsterdam, MD

## 2012-07-17 NOTE — Patient Instructions (Addendum)
Use the omnicef (antibiotic) as directed.  Let me know if you are not better in the next couple of days- Sooner if worse.

## 2012-07-23 ENCOUNTER — Telehealth: Payer: Self-pay

## 2012-07-23 NOTE — Telephone Encounter (Signed)
Spoke with pt, advised message from Dr Patsy Lager. Pt states he will just RTC tomorrow.

## 2012-07-23 NOTE — Telephone Encounter (Addendum)
PT WAS SEEN FOR A URI RECENTLY,  HE HAS NOTCIED AN IMPROVEMENT SINCE HIS OV BUT IS STILL EXPERIENCING  CHEST CONGESTION AND HEARING ISSUES(WATER IN EAR FEELING).  BEST# [phone] PHARMACY: [facility]

## 2012-07-23 NOTE — Telephone Encounter (Signed)
Looks like he should still be taking the antibiotic for a few more days.  As long as he is not running a fever I would allow some more time for the abx to work.  If he is running a fever or having ear pain please come back in.  Otherwise, some afrin nasal spray (for just a few days) may help clear his ear.  If this does not help or if ear symptoms persist please let us know.  The omnicef is a good abx for lung and ear infections generally

## 2012-07-24 ENCOUNTER — Telehealth: Payer: Self-pay

## 2012-07-24 ENCOUNTER — Ambulatory Visit (INDEPENDENT_AMBULATORY_CARE_PROVIDER_SITE_OTHER): Payer: No Typology Code available for payment source | Admitting: Family Medicine

## 2012-07-24 ENCOUNTER — Ambulatory Visit: Payer: No Typology Code available for payment source

## 2012-07-24 VITALS — BP 152/76 | HR 73 | Temp 97.6°F | Resp 18 | Ht 71.5 in | Wt 219.0 lb

## 2012-07-24 DIAGNOSIS — R0989 Other specified symptoms and signs involving the circulatory and respiratory systems: Secondary | ICD-10-CM

## 2012-07-24 DIAGNOSIS — Z9114 Patient's other noncompliance with medication regimen: Secondary | ICD-10-CM

## 2012-07-24 DIAGNOSIS — R0602 Shortness of breath: Secondary | ICD-10-CM

## 2012-07-24 DIAGNOSIS — Z9119 Patient's noncompliance with other medical treatment and regimen: Secondary | ICD-10-CM

## 2012-07-24 DIAGNOSIS — Z91199 Patient's noncompliance with other medical treatment and regimen due to unspecified reason: Secondary | ICD-10-CM

## 2012-07-24 LAB — POCT CBC
MCH, POC: 30 pg (ref 27–31.2)
MCV: 92.4 fL (ref 80–97)
MPV: 7.9 fL (ref 0–99.8)
POC Granulocyte: 5.6 (ref 2–6.9)
POC LYMPH PERCENT: 35.1 %L (ref 10–50)
POC MID %: 6.6 %M (ref 0–12)
Platelet Count, POC: 337 10*3/uL (ref 142–424)

## 2012-07-24 LAB — BASIC METABOLIC PANEL
Creat: 0.98 mg/dL (ref 0.50–1.35)
Sodium: 142 mEq/L (ref 135–145)

## 2012-07-24 MED ORDER — ALBUTEROL SULFATE HFA 108 (90 BASE) MCG/ACT IN AERS: 2.0000 | INHALATION_SPRAY | Freq: Four times a day (QID) | RESPIRATORY_TRACT | Status: DC | PRN

## 2012-07-24 MED ORDER — PREDNISONE 20 MG PO TABS: ORAL_TABLET | ORAL | Status: DC

## 2012-07-24 NOTE — Progress Notes (Signed)
[facility] [address], [place] [address] [phone]  Date:  07/24/2012   Name:  Andre Cook.   DOB:  1946/03/15   MRN:  [id number]  PCP:  Dow Adolph, MD    Chief Complaint: Follow-up, Cough and ear itchyness   History of Present Illness:  Andre Cook. is a 67 y.o. very pleasant male patient who presents with the following:  Here on 07/17/12 at which time we started [facility] for chest congestion/ bronchitis.  At that time his O2 sat was 95%.  CXR at that visit:  CHEST - 2 VIEW  Comparison: None.  Findings: The lungs are clear. Heart size is normal. No  pneumothorax or pleural fluid. The patient is status post CABG.  IMPRESSION:  No acute disease.   It seems he was taking the omnicef on an accelerated schedule- he has been taking more when he felt more sick.  This caused him to complete the course early- he finished the 10 day course after 5 or 6 days  He continues to note "lung congestion" and a cough.  He has not noted a fever.  He also notes that both ears are bothersome "there is some discomfort, I'm not sure if it's pain or itching." He has not had a fever, he does not have any chills or aches that he has noted (he has been lifting weighs so he is not sure if he is aching from that only).    No GI symptoms.   States that he "has no control when I'm sick, I just have to do whatever I can to feel better" and this is why he took his omnicef so quickly.  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  EF was 23- 35%  in 2009.  However, I do not have his most recent notes as he also receives care at the [place].  However, he states he has not seen [facility] [place] in a few years wither    No orthopnea, no pedal edeam  He notes wheezing.    He smoked for more than 20 years, but has quit a few years ago Patient Active Problem List  Diagnosis  . CARCINOMA, RENAL CELL  . HYPOTHYROIDISM  . DYSLIPIDEMIA  . HYPERTENSION  .  MYOCARDIAL INFARCTION  . EDEMA  . Hypothyroidism, postablative  . CAD (coronary artery disease)  . Obesity    Past Medical History  Diagnosis Date  . 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 Hypertension   . CAD (coronary artery disease) 2002    Post- op MI, CABG due to severe 3 vessel disease.  See cardiac cath report 02/02/2001  . Nephrolithiasis   . Dyslipidemia   . GERD (gastroesophageal reflux disease)   . Hyperglycemia   . Obesity   . MI (myocardial infarction) 2002    post- op 2002, severe 3 vessel disease, CABG.  Inferior wall MI  . 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 Carilyn Goodpasture  . Anxiety   . Heart disease   . Hypercholesterolemia   . Hypertrophy of prostate with urinary obstruction and other lower urinary tract symptoms (LUTS)     Past Surgical History  Procedure Laterality Date  . Appendectomy    . Coronary artery bypass graft  2007    4 vessels treated  . Tonsillectomy and adenoidectomy    . Finger amputation  right 4th finger: industrial accident  . 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 Isabel Caprice, clear cell carcinoma  . Renal biopsy    . Cardiac surgery    . Thyroidectomy      History  Substance Use Topics  . Smoking status: Former Smoker    Types: Cigarettes    Quit date: 10/02/2010  . Smokeless tobacco: Not on file  . Alcohol Use: Not on file    Family History  Problem Relation Age of Onset  . Heart disease Father   . COPD Mother   . Cancer Sister     Primary-  Lung    Allergies  Allergen Reactions  . Ciprofloxacin Rash    Medication list has been reviewed and updated.  Current Outpatient Prescriptions on File Prior to Visit  Medication Sig Dispense Refill  . amLODipine (NORVASC) 10 MG tablet Take 1 tablet (10 mg total) by mouth daily.  90 tablet  3  . hydrochlorothiazide (HYDRODIURIL) 25 MG tablet Take 1 tablet (25 mg total) by mouth daily.  90 tablet  3  . levothyroxine (SYNTHROID, LEVOTHROID)  112 MCG tablet Take 1 tablet (112 mcg total) by mouth daily.  90 tablet  3  . metoprolol (LOPRESSOR) 50 MG tablet Take 1 tablet (50 mg total) by mouth 2 (two) times daily.  180 tablet  3  . omeprazole (PRILOSEC) 20 MG capsule Take 1 capsule (20 mg total) by mouth daily.  90 capsule  3  . ramipril (ALTACE) 10 MG tablet Take 1 tablet (10 mg total) by mouth 2 (two) times daily.  180 tablet  3  . rosuvastatin (CRESTOR) 20 MG tablet Take 1 tablet (20 mg total) by mouth daily.  90 tablet  3  . cefdinir (OMNICEF) 300 MG capsule Take 1 capsule (300 mg total) by mouth 2 (two) times daily.  20 capsule  0   No current facility-administered medications on file prior to visit.    Review of Systems:  As per HPI- otherwise negative.   Physical Examination: Filed Vitals:   07/24/12 0811  BP: 152/76  Pulse: 73  Temp: 97.6 F (36.4 C)  Resp: 18   Filed Vitals:   07/24/12 0811  Height: 5' 11.5" (1.816 m)  Weight: 219 lb (99.338 kg)   Body mass index is 30.12 kg/(m^2). Ideal Body Weight: Weight in (lb) to have BMI = 25: 181.4  GEN: WDWN, NAD, Non-toxic, A & O x 3 HEENT: Atraumatic, Normocephalic. Neck supple. No masses, No LAD.  Bilateral TM wnl, oropharynx normal.  PEERL,EOMI.   Ears and Nose: No external deformity. CV: RRR, No M/G/R. No JVD. No thrill. No extra heart sounds. Auscultated congestion seems to be upper airway noise/ mucus PULM: CTA B, no wheezes, crackles, rhonchi. No retractions. No resp. distress. No accessory muscle use. ABD: S, NT, ND, +BS. No rebound. No HSM. EXTR: No c/c/e NEURO Normal gait.  PSYCH: Normally interactive. Conversant. Not depressed or anxious appearing.  Calm demeanor.   [facility] reading (PRIMARY) by  Dr. Patsy Lager. CXR:  No active disease  CHEST - 2 VIEW  Comparison: 07/17/2012  Findings: Upper normal heart size. Hyperaeration stable. Postoperative changes stable. Degenerative changes in the thoracic spine stable. No pneumothorax, pleural effusion,  consolidation.  IMPRESSION: No active cardiopulmonary disease.   Results for orders placed in visit on 07/24/12  POCT CBC      Result Value Range   WBC 9.6  4.6 - 10.2 K/uL   Lymph, poc 3.4  0.6 -  3.4   POC LYMPH PERCENT 35.1  10 - 50 %L   MID (cbc) 0.6  0 - 0.9   POC MID % 6.6  0 - 12 %M   POC Granulocyte 5.6  2 - 6.9   Granulocyte percent 58.3  37 - 80 %G   RBC 5.77  4.69 - 6.13 M/uL   Hemoglobin 17.3  14.1 - 18.1 g/dL   HCT, POC 40.9  81.1 - 53.7 %   MCV 92.4  80 - 97 fL   MCH, POC 30.0  27 - 31.2 pg   MCHC 32.5  31.8 - 35.4 g/dL   RDW, POC 91.4     Platelet Count, POC 337  142 - 424 K/uL   MPV 7.9  0 - 99.8 fL   Called poison control- main adverse effect from taking his omnicef too quickly would be GI effects, and these are absent.  Renal compromise is also possible- will check BMP today  Assessment and Plan: 1. SOB (shortness of breath)  Brain natriuretic peptide   Brain natriuretic peptide   DG Chest 2 View  2. Chest congestion  POCT CBC   POCT CBC   albuterol (PROVENTIL HFA;VENTOLIN HFA) 108 (90 BASE) MCG/ACT inhaler   predniSONE (DELTASONE) 20 MG tablet  3. Overuse of medication  Basic metabolic panel   Basic metabolic panel   Andre Cook is here with persistent cough- he has been on abx, although he took the course too quickly.  His CBC is normal and CXR is clear today so will not repeat abx.   May have some element of COPD contributing to his symptoms.  Will treat with an albuterol inhaler and short course of prednisone.  He does have a history of CHF although doubt active symptoms at this time, will check BNP Await BMP to rule- out electrolyte abnormalities or ARF.    Abbe Amsterdam, MD

## 2012-07-24 NOTE — Telephone Encounter (Signed)
ERROR

## 2012-07-24 NOTE — Patient Instructions (Addendum)
Buy some plain mucinex for your congestion/ phlegm. Use as directed  Take the prednisone- 2 pills a day for 3 days- exactly as directed.  DO NOT TAKE MORE Use the albuterol inhaler/ puffer as directed- DO NOT TAKE MORE.    I will be in touch with your labs as soon as they are available. At that time we can talk on the phone to be sure you are better.  Call me sooner if you are getting worse

## 2012-08-04 ENCOUNTER — Encounter: Payer: Self-pay | Admitting: Family Medicine

## 2012-08-17 ENCOUNTER — Encounter: Payer: Self-pay | Admitting: Family Medicine

## 2012-08-17 ENCOUNTER — Ambulatory Visit (INDEPENDENT_AMBULATORY_CARE_PROVIDER_SITE_OTHER): Payer: No Typology Code available for payment source | Admitting: Family Medicine

## 2012-08-17 VITALS — BP 138/80 | HR 78 | Temp 97.8°F | Resp 18 | Ht 72.0 in | Wt 225.0 lb

## 2012-08-17 DIAGNOSIS — R5383 Other fatigue: Secondary | ICD-10-CM

## 2012-08-17 DIAGNOSIS — Z760 Encounter for issue of repeat prescription: Secondary | ICD-10-CM

## 2012-08-17 DIAGNOSIS — E039 Hypothyroidism, unspecified: Secondary | ICD-10-CM

## 2012-08-17 DIAGNOSIS — R531 Weakness: Secondary | ICD-10-CM

## 2012-08-17 DIAGNOSIS — R05 Cough: Secondary | ICD-10-CM

## 2012-08-17 DIAGNOSIS — R251 Tremor, unspecified: Secondary | ICD-10-CM

## 2012-08-17 DIAGNOSIS — R5381 Other malaise: Secondary | ICD-10-CM

## 2012-08-17 DIAGNOSIS — R259 Unspecified abnormal involuntary movements: Secondary | ICD-10-CM

## 2012-08-17 LAB — T3, FREE: T3, Free: 2.6 pg/mL (ref 2.3–4.2)

## 2012-08-17 LAB — TSH: TSH: 1.003 u[IU]/mL (ref 0.350–4.500)

## 2012-08-17 MED ORDER — METOPROLOL TARTRATE 50 MG PO TABS: 50.0000 mg | ORAL_TABLET | Freq: Two times a day (BID) | ORAL | Status: DC

## 2012-08-17 MED ORDER — RAMIPRIL 10 MG PO TABS: 10.0000 mg | ORAL_TABLET | Freq: Two times a day (BID) | ORAL | Status: DC

## 2012-08-17 MED ORDER — TAMSULOSIN HCL 0.4 MG PO CAPS: 0.4000 mg | ORAL_CAPSULE | Freq: Every day | ORAL | Status: DC

## 2012-08-17 MED ORDER — AMLODIPINE BESYLATE 10 MG PO TABS: 10.0000 mg | ORAL_TABLET | Freq: Every day | ORAL | Status: DC

## 2012-08-17 NOTE — Patient Instructions (Addendum)
I have refilled all medications except Levothyroxine; I will address that one when your labs are back. I do not find any abnormality on chest exam. You can continue to use the cough medication for a little while longer. Drink plenty of fluids to maintain good hydration.  Take your thyroid medication separate from all other medications and supplements.

## 2012-08-17 NOTE — Progress Notes (Signed)
S:: Pt is 67 y.o. Cauc male here for f/u after treatment for bronchitis 1 month ago (at [facility]- CXR x 2 are negative). He has slightly prod cough w/o fever/ chills, sinus pain or congestion, SOB or DOE. He is not using Albuterol MDI; an OTC cough syrup " is just as effective". He has some substernal discomfort that he associates more w/ GERD; this symptom responds to Omeprazole. He feels like he still has generalized weakness, most evident when he walks on treadmill. His workout is shortened due to less stamina. He denies myalgias or arthralgias.  Pt also has a tremor which he feels is worse in last few months. Worsening tremor (mostly in hands) is not related to use of OTC cough medicine. He is concerned that he may be taking too much Levothyroxine.  ROS: As per HPI.  O:  Filed Vitals:   08/17/12 1532  BP: 138/80  Pulse: 78  Temp: 97.8 F (36.6 C)  Resp: 18   GEN: In NAD; WN,WD. HEENT: [place]/AT; EOMI w/ clear mildly injected conj and normal sclerae. EACs normal; TMs dull w/o effusion or erythema. Hearing is normal. NT sinuses. Post ph erythematous w/o exudate or edema. NECK: Supple w/o LAN or TMG. No JVD. COR: RRR. No m/g/r/. LUNGS: CTA w/ diminished BS at bases but no rales or rhonchi. No resp distress. BACK: No CVAT. MS: MAEs; no c/c/e. SKIN: W&D; no rashes or pallor. NEURO: A&O x 3; CNs intact except speech is mildly dysarthric. Tremor noted in hands when arms extended. Otherwise nonfocal.  A/P: Cough- post-bronchitis; reassurance. Reviewed CXR from Feb 2014 w/ pt ( both negative for active disease). Symptomatic measures advised (throat lozenges or cough drops, fluids, humidifier).  Tremor- may be associated w/ OTC medication that pt takes for cough; advise discontinuing this product. Check TFTs.  Unspecified hypothyroidism - Plan: TSH, T3, Free; continue current dose of medication pending labs. Advised to take this medication separate from other meds and supplements.  Weakness  generalized - Plan: Vitamin D, 25-hydroxy

## 2012-08-19 NOTE — Progress Notes (Signed)
Quick Note:  Please notify pt that results are normal.   Provide pt with copy of labs. ______ 

## 2012-08-21 ENCOUNTER — Encounter: Payer: Self-pay | Admitting: *Deleted

## 2012-09-30 ENCOUNTER — Other Ambulatory Visit: Payer: Self-pay | Admitting: Family Medicine

## 2012-11-10 ENCOUNTER — Ambulatory Visit (INDEPENDENT_AMBULATORY_CARE_PROVIDER_SITE_OTHER): Payer: Medicare Other | Admitting: Emergency Medicine

## 2012-11-10 VITALS — BP 160/92 | HR 60 | Temp 98.0°F | Resp 20 | Ht 71.5 in | Wt 226.0 lb

## 2012-11-10 DIAGNOSIS — I2589 Other forms of chronic ischemic heart disease: Secondary | ICD-10-CM

## 2012-11-10 DIAGNOSIS — R42 Dizziness and giddiness: Secondary | ICD-10-CM

## 2012-11-10 DIAGNOSIS — I255 Ischemic cardiomyopathy: Secondary | ICD-10-CM | POA: Insufficient documentation

## 2012-11-10 DIAGNOSIS — I2510 Atherosclerotic heart disease of native coronary artery without angina pectoris: Secondary | ICD-10-CM

## 2012-11-10 DIAGNOSIS — I10 Essential (primary) hypertension: Secondary | ICD-10-CM

## 2012-11-10 NOTE — Patient Instructions (Addendum)
appt today with Dr Jacinto Halim, at 3pm [address] [place]  Driving directions to [facility] [id number]  [phone]  - more info    [address]  [place], [place] [address]     1. Head south on [address] toward [address]      0.5 mi    2. Sharp left onto [address]      0.6 mi    3. Turn left onto the [address] ramp      0.2 mi    4. Merge onto [address]      3.0 mi    5. Continue straight to stay on [address]      0.4 mi    6. Slight left to stay on [address]      1.2 mi    7.take left onto [address] destination will be on your right [address]     0.1 mi         361 ft            Cardiomyopathy Cardiomyopathy means a disease of the heart muscle. The heart muscle becomes enlarged or stiff. The heart is not able to pump enough blood or deliver enough oxygen to the body. This leads to heart failure and is the number one reason for heart transplants.  TYPES OF CARDIOMYOPATHY INCLUDE: DILATED  The most common type. The heart muscle is stretched out and weak so there is less blood pumped out.   Some causes:  Disease of the arteries of the heart (ischemia).  Heart attack with muscle scar.  Leaky or damaged valves.  After a viral illness.  Smoking.  High cholesterol.  Diabetes or overactive thyroid.  Alcohol or drug abuse.  High blood pressure.  May be reversible. HYPERTROPHIC The heart muscle grows bigger so there is less room for blood in the ventricle, and not enough blood is pumped out.   Causes include:  Mitral valve leaks.  Inherited tendency (from your family).  No explanation (idiopathic).  May be a cause of sudden death in young athletes with no symptoms. RESTRICTIVE The heart muscle becomes stiff, but not always larger. The heart has to work harder and will get weaker. Abnormal heart beats or rhythm (arrhythmia) are common.  Some causes:  Diseases in other parts  of the body which may produce abnormal deposits in the heart muscle.  Probably not inherited.  A result of radiation treatment for cancer. SYMPTOMS OF ALL TYPES:  Less able to exercise or tolerate physical activity.  Palpitations.  Irregular heart beat, heart arrhythmias.  Shortness of breath, even at rest.  Chest pain.  Lightheadedness or fainting. TREATMENT  Life-style changes including reducing salt, lowering cholesterol, stop smoking.  Manage contributing causes with medications.  Medicines to help reduce the fluids in the body.  An implanted cardioverter defibrillator (ICD) to improve heart function and correct arrhythmias.  Medications to relax the blood vessels and make it easier for the heart to pump.  Drugs that help regulate heart beat and improve heart relaxation, reducing the work of the heart.  Myomectomy for patients with hypertrophic cardiomyopathy and severe problems. This is a surgical procedure that removes a portion of the thickened muscle wall in order to improve heart output and provide symptom relief.  A heart transplant is an option in carefully applied circumstances. SEEK IMMEDIATE MEDICAL CARE IF:   You  have severe chest pain, especially if the pain is crushing or pressure-like and spreads to the arms, back, neck, or jaw, or if you have sweating, feeling sick to your stomach (nausea), or shortness of breath. THIS IS AN EMERGENCY. Do not wait to see if the pain will go away. Get medical help at once. Call your local emergency services (911 in [place] DO NOT drive yourself to the hospital.  You develop severe shortness of breath.  You begin to cough up bloody sputum.  You are unable to sleep because you cannot breathe.  You gain weight due to fluid retention.  You develop painful swelling in your calf or leg.  You feel your heart racing and it does not go away or happens when you are resting. Document Released: 08/13/2004 Document Revised:  08/23/2011 Document Reviewed: 01/17/2008 [facility] Patient Information 2014 [place], [place].

## 2012-11-10 NOTE — Progress Notes (Signed)
[facility] [address] [place] [address] [phone]  Date:  11/10/2012   Name:  Andre Cook.   DOB:  1945/10/03   MRN:  [id number]  PCP:  Dow Adolph, MD    Chief Complaint: Illness, Medication Management and Muscle Pain   History of Present Illness:  Andre Cook. is a 67 y.o. very pleasant male patient who presents with the following:  Noncompliant with medications.  Has experienced dizzy spells sporadically and stopped amlodipine as he felt his blood pressure was probably too low.  Took his antibiotic too aggressively and ran out in 5 days rather than 10.  Stopped his statin due to "adverse side effects".  Now to office with dizzy spells and persistent cough.  Denies wheezing.  Has episodic periods of "suffocation" while on a treadmill at the gym.  Pending an implantable defibrillator for "poor blood flow" but really has no insight.  Denies sensation of tachycardia or arrhythmia with shortness of breath.  No nausea or vomiting.  No chest pain.  No diaphoresis.  No other neuro symptoms.    Patient Active Problem List   Diagnosis Date Noted  . CAD (coronary artery disease) 07/02/2011  . Obesity 07/02/2011  . Hypothyroidism, postablative   . CARCINOMA, RENAL CELL 10/31/2008  . HYPOTHYROIDISM 10/31/2008  . DYSLIPIDEMIA 10/31/2008  . HYPERTENSION 10/31/2008  . MYOCARDIAL INFARCTION 10/31/2008    Past Medical History  Diagnosis Date  . 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 Hypertension   . CAD (coronary artery disease) 2002    Post- op MI, CABG due to severe 3 vessel disease.  See cardiac cath report 02/02/2001  . Nephrolithiasis   . Dyslipidemia   . GERD (gastroesophageal reflux disease)   . Hyperglycemia   . Obesity   . MI (myocardial infarction) 2002    post- op 2002, severe 3 vessel disease, CABG.  Inferior wall MI  . 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 Carilyn Goodpasture  . Anxiety   .  Heart disease   . Hypercholesterolemia   . Hypertrophy of prostate with urinary obstruction and other lower urinary tract symptoms (LUTS)     Past Surgical History  Procedure Laterality Date  . Appendectomy    . Coronary artery bypass graft  2007    4 vessels treated  . Tonsillectomy and adenoidectomy    . Finger amputation      right 4th finger: industrial accident  . 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 Isabel Caprice, clear cell carcinoma  . Renal biopsy    . Cardiac surgery    . Thyroidectomy      History  Substance Use Topics  . Smoking status: Former Smoker    Types: Cigarettes    Quit date: 10/02/2010  . Smokeless tobacco: Not on file  . Alcohol Use: No    Family History  Problem Relation Age of Onset  . Heart disease Father   . COPD Mother   . Cancer Sister     Primary-  Lung    Allergies  Allergen Reactions  . Ciprofloxacin Rash    Medication list has been reviewed and updated.  Current Outpatient Prescriptions on File Prior to Visit  Medication Sig Dispense Refill  . cholecalciferol (VITAMIN D) 1000 UNITS tablet Take 1,000 Units by mouth daily.      . metoprolol (LOPRESSOR) 50 MG tablet Take 1 tablet (50 mg total) by mouth  2 (two) times daily.  180 tablet  3  . ramipril (ALTACE) 10 MG tablet Take 1 tablet (10 mg total) by mouth 2 (two) times daily.  180 tablet  3  . SYNTHROID 112 MCG tablet TAKE 1 TABLET DAILY  90 tablet  1  . tamsulosin (FLOMAX) 0.4 MG CAPS Take 1 capsule (0.4 mg total) by mouth daily.  90 capsule  3   No current facility-administered medications on file prior to visit.    Review of Systems:  As per HPI, otherwise negative.    Physical Examination: Filed Vitals:   11/10/12 1110  BP: 160/92  Pulse: 60  Temp: 98 F (36.7 C)  Resp: 20   Filed Vitals:   11/10/12 1110  Height: 5' 11.5" (1.816 m)  Weight: 226 lb (102.513 kg)   Body mass index is 31.08 kg/(m^2). Ideal Body Weight: Weight in (lb) to have BMI = 25: 181.4  GEN:  WDWN, NAD, Non-toxic, A & O x 3 HEENT: Atraumatic, Normocephalic. Neck supple. No masses, No LAD. Ears and Nose: No external deformity. CV: RRR, No M/G/R. No JVD. No thrill. No extra heart sounds. PULM: CTA B, no wheezes, crackles, rhonchi. No retractions. No resp. distress. No accessory muscle use. ABD: S, NT, ND, +BS. No rebound. No HSM. EXTR: No c/c/e NEURO Normal gait.  PSYCH: Normally interactive. Conversant. Not depressed or anxious appearing.  Calm demeanor.    Assessment and Plan: Discussed noncompliance with patient at length as well as his enthusiasm for treating himself. He was encouraged to follow up with his cardiologist. There is a possibility that his dizziness and shortness of breath are arrhythmia related.  Signed,  Phillips Odor, MD

## 2012-11-23 ENCOUNTER — Encounter: Payer: Self-pay | Admitting: Internal Medicine

## 2012-11-24 ENCOUNTER — Ambulatory Visit (INDEPENDENT_AMBULATORY_CARE_PROVIDER_SITE_OTHER): Payer: Medicare Other | Admitting: Family Medicine

## 2012-11-24 ENCOUNTER — Encounter: Payer: Self-pay | Admitting: Family Medicine

## 2012-11-24 VITALS — BP 126/74 | HR 59 | Temp 97.7°F | Resp 16 | Ht 71.0 in | Wt 222.0 lb

## 2012-11-24 DIAGNOSIS — E669 Obesity, unspecified: Secondary | ICD-10-CM

## 2012-11-24 DIAGNOSIS — Z0000 Encounter for general adult medical examination without abnormal findings: Secondary | ICD-10-CM

## 2012-11-24 DIAGNOSIS — Z139 Encounter for screening, unspecified: Secondary | ICD-10-CM

## 2012-11-24 DIAGNOSIS — Z131 Encounter for screening for diabetes mellitus: Secondary | ICD-10-CM

## 2012-11-24 DIAGNOSIS — Z125 Encounter for screening for malignant neoplasm of prostate: Secondary | ICD-10-CM

## 2012-11-24 DIAGNOSIS — N400 Enlarged prostate without lower urinary tract symptoms: Secondary | ICD-10-CM

## 2012-11-24 DIAGNOSIS — Z23 Encounter for immunization: Secondary | ICD-10-CM

## 2012-11-24 LAB — POCT URINALYSIS DIPSTICK
Bilirubin, UA: NEGATIVE
Blood, UA: NEGATIVE
Ketones, UA: NEGATIVE
Protein, UA: NEGATIVE
Spec Grav, UA: 1.02
pH, UA: 6

## 2012-11-24 MED ORDER — LEVOTHYROXINE SODIUM 112 MCG PO TABS: ORAL_TABLET | ORAL | Status: DC

## 2012-11-24 NOTE — Progress Notes (Signed)
Subjective:    Patient ID: Andre Cook., male    DOB: 1945/11/29, 67 y.o.   MRN: [id number]  HPI  This 67 y.o. Cauc male is here for "Welcome to Medicare" physical. He is a retired Engineer, drilling and has been going to [place] facility for chronic care. He has been getting Flu vaccine there  as well as management for BPH by Urology. He requests a referral to [facility] group   for convenience. He has CAD and dyslipidemia, recently evaluated by Dr. Jacinto Halim. Pt had labs on  11/13/2012 (scanned into EMR) and has follow-up with Cardiology within next month.   HCM: Vision- every 2 years; has an early cataract on R.            CRS- 2007 (normal).   PMHx, Soc Hx and Fam Hx reviewed. Problem List reviewed.  Review of Systems  Constitutional: Negative.   HENT: Negative.   Eyes: Negative.   Respiratory: Negative for cough, chest tightness, shortness of breath and wheezing.   Cardiovascular: Negative.   Gastrointestinal: Negative.   Endocrine: Negative.   Genitourinary:       Pt has BPH and has regular follow-up w/ Urology at [facility] facility.  Musculoskeletal: Positive for arthralgias. Negative for myalgias, joint swelling and gait problem.  Skin: Negative.   Allergic/Immunologic: Negative.   Neurological: Negative.   Hematological: Negative.   Psychiatric/Behavioral: Negative.        Objective:   Physical Exam  Nursing note and vitals reviewed. Constitutional: He is oriented to person, place, and time. Vital signs are normal. He appears well-developed and well-nourished. No distress.  HENT:  Head: Normocephalic and atraumatic.  Right Ear: Hearing, external ear and ear canal normal. Tympanic membrane is scarred.  Left Ear: Hearing, external ear and ear canal normal. Tympanic membrane is scarred.  Nose: Nose normal. No mucosal edema, rhinorrhea, nasal deformity or septal deviation.  Mouth/Throat: Uvula is midline, oropharynx is clear and moist and mucous membranes are normal. No oral  lesions. No dental caries.  Eyes: Conjunctivae, EOM and lids are normal. Pupils are equal, round, and reactive to light. No scleral icterus.  Pt has routine vision exams with [facility].  Neck: Normal range of motion. Neck supple. No JVD present. No thyromegaly present.  Cardiovascular: Normal rate, regular rhythm, S1 normal, S2 normal, normal heart sounds and intact distal pulses.  Exam reveals no gallop and no friction rub.   No murmur heard. Pulmonary/Chest: Effort normal and breath sounds normal. No respiratory distress. He has no wheezes. He exhibits no tenderness.  Abdominal: Soft. Normal appearance. He exhibits no distension, no abdominal bruit, no pulsatile midline mass and no mass. Bowel sounds are decreased. There is no hepatosplenomegaly. There is no tenderness. There is no guarding and no CVA tenderness. A hernia is present.  R flank- herniated soft tissue.  Genitourinary: Rectum normal. Rectal exam shows no external hemorrhoid, no fissure, no mass, no tenderness and anal tone normal. Guaiac negative stool. Prostate is enlarged. Prostate is not tender.  Musculoskeletal: Normal range of motion. He exhibits no edema and no tenderness.  Mild deg changes in digits.  Lymphadenopathy:    He has no cervical adenopathy.  Neurological: He is alert and oriented to person, place, and time. He has normal reflexes. No cranial nerve deficit. He exhibits normal muscle tone. Coordination normal.  Skin: Skin is warm and dry. No rash noted. No erythema. No pallor.  Psychiatric: He has a normal mood and affect. His  behavior is normal. Judgment and thought content normal.   Results for orders placed in visit on 11/24/12             POCT URINALYSIS DIPSTICK      Result Value Range   Color, UA yellow     Clarity, UA clear     Glucose, UA neg     Bilirubin, UA neg     Ketones, UA neg     Spec Grav, UA 1.020     Blood, UA neg     pH, UA 6.0     Protein, UA neg     Urobilinogen, UA 0.2      Nitrite, UA neg     Leukocytes, UA Trace    POCT GLYCOSYLATED HEMOGLOBIN (HGB A1C)      Result Value Range   Hemoglobin A1C 5.5    IFOBT (OCCULT BLOOD)      Result Value Range   IFOBT Negative         Assessment & Plan:  Routine general medical examination at a health care facility -   Diabetes mellitus screening- Normal A1c; continue healthy lifestyle changes.  Special screening for malignant neoplasm of prostate -  Plan: PSA, Medicare  Hypertrophy of prostate without urinary obstruction and other lower urinary tract symptoms (LUTS) - [facility].  Plan: Ambulatory referral to Urology  Obesity, unspecified - Plan: POCT glycosylated hemoglobin (Hb A1C)  Need for prophylactic vaccination against Streptococcus pneumoniae (pneumococcus) - Plan: Pneumococcal polysaccharide vaccine 23-valent greater than or equal to 2yo subcutaneous/IM  Meds ordered this encounter  Medications  . triamterene-hydrochlorothiazide (MAXZIDE-25) 37.5-25 MG per tablet    Sig: Take 1 tablet by mouth daily.  Marland Kitchen levothyroxine (SYNTHROID) 112 MCG tablet    Sig: TAKE 1 TABLET DAILY    Dispense:  90 tablet    Refill:  1

## 2012-11-24 NOTE — Patient Instructions (Addendum)
Keeping you healthy  Get these tests  Blood pressure- Have your blood pressure checked once a year by your healthcare provider.  Normal blood pressure is 120/80  Weight- Have your body mass index (BMI) calculated to screen for obesity.  BMI is a measure of body fat based on height and weight. You can also calculate your own BMI at [email or web address].  Cholesterol- Have your cholesterol checked every year.  Diabetes- Have your blood sugar checked regularly if you have high blood pressure, high cholesterol, have a family history of diabetes or if you are overweight.  Screening for Colon Cancer- Colonoscopy starting at age 62.  Screening may begin sooner depending on your family history and other health conditions. Follow up colonoscopy as directed by your Gastroenterologist.  Screening for Prostate Cancer- Both blood work (PSA) and a rectal exam help screen for Prostate Cancer.  Screening begins at age 40 with African-American men and at age 75 with Caucasian men.  Screening may begin sooner depending on your family history.  Take these medicines  Aspirin- One aspirin daily can help prevent Heart disease and Stroke.  Flu shot- Every fall.  Tetanus- Every 10 years. You had this vaccine in 2008; next dose due in 2018.  Zostavax- Once after the age of 58 to prevent Shingles. You have a prescription for this vaccine; take it to [facility] or [address].  Pneumonia shot- Once after the age of 46; if you are younger than 53, ask your healthcare provider if you need a Pneumonia shot.  Take these steps  Don't smoke- If you do smoke, talk to your doctor about quitting.  For tips on how to quit, go to www.smokefree.gov or call 1-800-QUIT-NOW.  Be physically active- Exercise 5 days a week for at least 30 minutes.  If you are not already physically active start slow and gradually work up to 30 minutes of moderate physical activity.  Examples of moderate activity include walking  briskly, mowing the yard, dancing, swimming, bicycling, etc.  Eat a healthy diet- Eat a variety of healthy food such as fruits, vegetables, low fat milk, low fat cheese, yogurt, lean meant, poultry, fish, beans, tofu, etc. For more information go to www.thenutritionsource.org  Drink alcohol in moderation- Limit alcohol intake to less than two drinks a day. Never drink and drive.  Dentist- Brush and floss twice daily; visit your dentist twice a year.  Depression- Your emotional health is as important as your physical health. If you're feeling down, or losing interest in things you would normally enjoy please talk to your healthcare provider.  Eye exam- Visit your eye doctor every year.  Safe sex- If you may be exposed to a sexually transmitted infection, use a condom.  Seat belts- Seat belts can save your life; always wear one.  Smoke/Carbon Monoxide detectors- These detectors need to be installed on the appropriate level of your home.  Replace batteries at least once a year.  Skin cancer- When out in the sun, cover up and use sunscreen 15 SPF or higher.  Violence- If anyone is threatening you, please tell your healthcare provider.  Living Will/ Health care power of attorney- Speak with your healthcare provider and family.

## 2012-11-25 LAB — PSA, MEDICARE: PSA: 2.09 ng/mL (ref <=4.00)

## 2012-11-25 NOTE — Progress Notes (Signed)
Quick Note:  Please contact pt and advise that the prostate blood test is normal.  Copy to pt. ______

## 2012-11-26 DIAGNOSIS — N400 Enlarged prostate without lower urinary tract symptoms: Secondary | ICD-10-CM | POA: Insufficient documentation

## 2012-11-27 ENCOUNTER — Encounter: Payer: Self-pay | Admitting: Internal Medicine

## 2012-12-06 ENCOUNTER — Other Ambulatory Visit (HOSPITAL_COMMUNITY): Payer: Self-pay | Admitting: Adult Health

## 2012-12-06 ENCOUNTER — Ambulatory Visit (HOSPITAL_COMMUNITY)
Admission: RE | Admit: 2012-12-06 | Discharge: 2012-12-06 | Disposition: A | Discharge status: Home / Self Care | Payer: Medicare Other | Source: Ambulatory Visit | Attending: Adult Health | Admitting: Adult Health

## 2012-12-06 DIAGNOSIS — I2510 Atherosclerotic heart disease of native coronary artery without angina pectoris: Secondary | ICD-10-CM | POA: Insufficient documentation

## 2012-12-06 DIAGNOSIS — Z87891 Personal history of nicotine dependence: Secondary | ICD-10-CM | POA: Insufficient documentation

## 2012-12-06 DIAGNOSIS — Z951 Presence of aortocoronary bypass graft: Secondary | ICD-10-CM | POA: Insufficient documentation

## 2012-12-06 DIAGNOSIS — C649 Malignant neoplasm of unspecified kidney, except renal pelvis: Secondary | ICD-10-CM

## 2012-12-06 DIAGNOSIS — I700 Atherosclerosis of aorta: Secondary | ICD-10-CM | POA: Insufficient documentation

## 2012-12-06 DIAGNOSIS — I10 Essential (primary) hypertension: Secondary | ICD-10-CM | POA: Insufficient documentation

## 2012-12-18 ENCOUNTER — Other Ambulatory Visit: Payer: Self-pay

## 2012-12-18 MED ORDER — LEVOTHYROXINE SODIUM 112 MCG PO TABS: ORAL_TABLET | ORAL | Status: DC

## 2013-03-26 ENCOUNTER — Ambulatory Visit (INDEPENDENT_AMBULATORY_CARE_PROVIDER_SITE_OTHER): Payer: Medicare Other

## 2013-03-26 DIAGNOSIS — Z23 Encounter for immunization: Secondary | ICD-10-CM

## 2013-03-30 ENCOUNTER — Ambulatory Visit (INDEPENDENT_AMBULATORY_CARE_PROVIDER_SITE_OTHER): Payer: Medicare Other | Admitting: Family Medicine

## 2013-03-30 ENCOUNTER — Encounter: Payer: Self-pay | Admitting: Family Medicine

## 2013-03-30 VITALS — BP 130/70 | HR 60 | Temp 97.3°F | Resp 16 | Ht 72.0 in | Wt 237.2 lb

## 2013-03-30 DIAGNOSIS — E039 Hypothyroidism, unspecified: Secondary | ICD-10-CM

## 2013-03-30 DIAGNOSIS — H6981 Other specified disorders of Eustachian tube, right ear: Secondary | ICD-10-CM

## 2013-03-30 DIAGNOSIS — H6980 Other specified disorders of Eustachian tube, unspecified ear: Secondary | ICD-10-CM

## 2013-03-30 DIAGNOSIS — E785 Hyperlipidemia, unspecified: Secondary | ICD-10-CM

## 2013-03-30 LAB — LIPID PANEL
HDL: 38 mg/dL — ABNORMAL LOW (ref >39)
LDL Cholesterol: 71 mg/dL (ref 0–99)

## 2013-03-30 LAB — ALT: ALT: 25 U/L (ref 0–53)

## 2013-03-30 MED ORDER — DESLORATADINE 5 MG PO TABS: 5.0000 mg | ORAL_TABLET | Freq: Every day | ORAL | Status: DC

## 2013-03-30 NOTE — Patient Instructions (Signed)
Barotitis Media Barotitis media is soreness (inflammation) of the area behind the eardrum (middle ear). This occurs when the auditory tube (Eustachian tube) leading from the back of the throat to the eardrum is blocked. When it is blocked air cannot move in and out of the middle ear to equalize pressure changes. These pressure changes come from changes in altitude when:  Flying.  Driving in the mountains.  Diving. Problems are more likely to occur with pressure changes during times when you are congested as from:  Hay fever.  Upper respiratory infection.  A cold. Damage or hearing loss (barotrauma) caused by this may be permanent. HOME CARE INSTRUCTIONS   Use medicines as recommended by your caregiver. Over the counter medicines will help unblock the canal and can help during times of air travel.  Do not put anything into your ears to clean or unplug them. Eardrops will not be helpful.  Do not swim, dive, or fly until your caregiver says it is all right to do so. If these activities are necessary, chewing gum with frequent swallowing may help. It is also helpful to hold your nose and gently blow to pop your ears for equalizing pressure changes. This forces air into the Eustachian tube.  For little ones with problems, give your baby a bottle of water or juice during periods when pressure changes would be anticipated such as during take offs and landings associated with air travel.  Only take over-the-counter or prescription medicines for pain, discomfort, or fever as directed by your caregiver.  A decongestant may be helpful in de-congesting the middle ear and make pressure equalization easier. This can be even more effective if the drops (spray) are delivered with the head lying over the edge of a bed with the head tilted toward the ear on the affected side.  If your caregiver has given you a follow-up appointment, it is very important to keep that appointment. Not keeping the  appointment could result in a chronic or permanent injury, pain, hearing loss and disability. If there is any problem keeping the appointment, you must call back to this facility for assistance. SEEK IMMEDIATE MEDICAL CARE IF:   You develop a severe headache, dizziness, severe ear pain, or bloody or pus-like drainage from your ears.  An oral temperature above 102 F (38.9 C) develops.  Your problems do not improve or become worse. MAKE SURE YOU:   Understand these instructions.  Will watch your condition.  Will get help right away if you are not doing well or get worse. Document Released: 05/28/2000 Document Revised: 08/23/2011 Document Reviewed: 01/04/2008 ExitCare Patient Information 2014 [employer].  

## 2013-03-31 NOTE — Progress Notes (Signed)
Quick Note:  Please advise pt regarding following labs...  Current results are stable. Continue all current medications.  Copy to pt. ______

## 2013-03-31 NOTE — Progress Notes (Signed)
S:  This 67 y.o. Cauc male presents today w/ c/o R ear problem, onset after episode of bronchitis treated ~ 8 months ago. Since that time he has abnormal strange sensation in R ear, symptom increased w/ swallowing. He describes a feeling of fullness in R ear w/o pain or decreased hearing. He reports increased sensation when tilting head to L. Pt denies chronic allergy or sinus problems. He has no fever, vision disturbances, rhinorrhea, sore throat or PND. He swallows w/o difficulty and denies cough or SOB/DOE.  Pt has well controlled HTN and hypothyroidism; he is compliant w/ medications w/o adverse effects. He has maintained regular activity level and appetite. No reports of CP or tightness, palpitations, edema, myalgias, changes in skin or hair, dizziness, weakness or syncope.  Patient Active Problem List   Diagnosis Date Noted  . Hypertrophy of prostate without urinary obstruction and other lower urinary tract symptoms (LUTS) 11/26/2012  . Cardiomyopathy, ischemic 11/10/2012  . CAD (coronary artery disease) 07/02/2011  . Obesity 07/02/2011  . Hypothyroidism, postablative   . CARCINOMA, RENAL CELL 10/31/2008  . HYPOTHYROIDISM 10/31/2008  . DYSLIPIDEMIA 10/31/2008  . HYPERTENSION 10/31/2008  . MYOCARDIAL INFARCTION 10/31/2008   PMHx, Surg Hx, Soc Hx and Fam Hx reviewed. Medications reconciled.  ROS: As per HPI.  O: Filed Vitals:   03/30/13 1338  BP: 130/70  Pulse: 60  Temp: 97.3 F (36.3 C)  Resp: 16   GEN: In NAD; WN,WD. HEENT: [place]/AT. EOMI w/clear conj/sclerae. EACs and canals normal. TMs w/ good light reflex, w/o erythema or effusion. Nasal mucosa erythematous; no rhinorrhea. Post ph erythematous w/o exudate or lesions. Sinuses- NT. NECK: Supple w/o LAN or TMG. Carotids w/o bruits. COR: RRR. Normal S1 and S2. No m/g/r. LUNGS: CTA; normal resp rate and effort. SKIN: W&D; intact w/ erythema but no rashes, ecchymoses, lesions or pallor. NEURO: A&O x 3; CNs intact. Nonfocal.    A/P: Eustachian tube dysfunction, right- Trial Clarinex 5 mg  1 tablet daily; OTC AYR nasal mist.  HYPOTHYROIDISM - Stable; no medication change.       Plan: TSH  DYSLIPIDEMIA - May 2013 lipid panel ( TChol= 125, TGs= 148, HDL= 35, LDL= 60)    Plan: Lipid panel, ALT  Meds ordered this encounter  Medications  . PRESCRIPTION MEDICATION    Sig: Amlodipine Besylate 10 mg taking one daily  . atorvastatin (LIPITOR) 20 MG tablet    Sig: Take 20 mg by mouth daily.  Marland Kitchen DISCONTD: desloratadine (CLARINEX) 5 MG tablet    Sig: Take 1 tablet (5 mg total) by mouth daily.    Dispense:  30 tablet    Refill:  5  . desloratadine (CLARINEX) 5 MG tablet    Sig: Take 1 tablet (5 mg total) by mouth daily.    Dispense:  30 tablet    Refill:  5

## 2013-05-16 ENCOUNTER — Ambulatory Visit (INDEPENDENT_AMBULATORY_CARE_PROVIDER_SITE_OTHER): Payer: Medicare Other | Admitting: Family Medicine

## 2013-05-16 VITALS — BP 168/79 | HR 71 | Temp 98.2°F | Resp 16 | Ht 71.0 in | Wt 241.0 lb

## 2013-05-16 DIAGNOSIS — M25571 Pain in right ankle and joints of right foot: Secondary | ICD-10-CM

## 2013-05-16 DIAGNOSIS — Z1159 Encounter for screening for other viral diseases: Secondary | ICD-10-CM

## 2013-05-16 DIAGNOSIS — H9319 Tinnitus, unspecified ear: Secondary | ICD-10-CM

## 2013-05-16 DIAGNOSIS — H9313 Tinnitus, bilateral: Secondary | ICD-10-CM

## 2013-05-16 DIAGNOSIS — I10 Essential (primary) hypertension: Secondary | ICD-10-CM

## 2013-05-16 DIAGNOSIS — R202 Paresthesia of skin: Secondary | ICD-10-CM

## 2013-05-16 DIAGNOSIS — R209 Unspecified disturbances of skin sensation: Secondary | ICD-10-CM

## 2013-05-16 LAB — BASIC METABOLIC PANEL
Chloride: 100 mEq/L (ref 96–112)
Creat: 0.89 mg/dL (ref 0.50–1.35)
Sodium: 137 mEq/L (ref 135–145)

## 2013-05-16 NOTE — Patient Instructions (Signed)
For "cotton-mouth" and scratchy throat- get a humidifier and fill it with water; adding moisture into the air in your home can help decrease some of the symptoms you are having.  Also get a saline nasal mist - AYR is a good brand. Use this spray at bedtime.  You will be contacted about the details of the carotid artery study. I will then be in touch with you once I have the results.

## 2013-05-17 ENCOUNTER — Telehealth: Payer: Self-pay

## 2013-05-17 NOTE — Progress Notes (Signed)
Quick Note:  Please notify pt that results are normal.   Provide pt with copy of labs. ______ 

## 2013-05-17 NOTE — Telephone Encounter (Signed)
Angie from [employer] is calling to say that one of the patients medications is on back order would like a nurse or doctor to call her back about this at [phone] reference number is [phone]

## 2013-05-21 ENCOUNTER — Encounter: Payer: Self-pay | Admitting: Cardiology

## 2013-05-21 ENCOUNTER — Encounter: Payer: Self-pay | Admitting: Family Medicine

## 2013-05-21 ENCOUNTER — Ambulatory Visit (HOSPITAL_COMMUNITY): Payer: Medicare Other | Attending: Family Medicine

## 2013-05-21 DIAGNOSIS — I6529 Occlusion and stenosis of unspecified carotid artery: Secondary | ICD-10-CM

## 2013-05-21 DIAGNOSIS — R209 Unspecified disturbances of skin sensation: Secondary | ICD-10-CM | POA: Insufficient documentation

## 2013-05-21 DIAGNOSIS — R42 Dizziness and giddiness: Secondary | ICD-10-CM

## 2013-05-21 DIAGNOSIS — H9319 Tinnitus, unspecified ear: Secondary | ICD-10-CM | POA: Insufficient documentation

## 2013-05-21 DIAGNOSIS — H9313 Tinnitus, bilateral: Secondary | ICD-10-CM

## 2013-05-21 DIAGNOSIS — R202 Paresthesia of skin: Secondary | ICD-10-CM

## 2013-05-21 MED ORDER — LEVOTHYROXINE SODIUM 112 MCG PO TABS: ORAL_TABLET | ORAL | Status: DC

## 2013-05-21 NOTE — Telephone Encounter (Signed)
Which medication ?

## 2013-05-21 NOTE — Telephone Encounter (Signed)
Called [facility]. flomax was out of stock, but now available,they will fill

## 2013-05-21 NOTE — Progress Notes (Signed)
S:  This 67 y.o. Cauc male presents with multiple complaints and history is disjointed and somewhat vague. He c/o "cotton mouth" w/ scratchy throat. 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 This symptoms is no worse and unaccompanied by new symptoms such as vision disturbances, dizziness, HA, sinus pain or congestion, difficulty swallowing or voice change. He does have numbness which he localizes to right facial and neck area w/ occasional radiation into R arm. He denies loss of use of upper extremities. He endorses crepitus in posterior neck with flexion and rotation. Has not had C-spine xrays recently.  Pt c/o R ankle and lateral foot pain which is positional. He experiences the discomfort only when he flexes and inverts his foot. He can bear weight and ambulate w/o pain. He notes no discoloration or muscle cramping or spasms. There is no hx of trauma.  HTN- pt is complaint w/ medications and BP measurements at other times have been "good". SBP= 140-150. Pt denies diaphoresis, CP or tightness, palpitations, SOB or DOE, cough or edema. He has no medication adverse effects.  Patient Active Problem List   Diagnosis Date Noted  . Hypertrophy of prostate without urinary obstruction and other lower urinary tract symptoms (LUTS) 11/26/2012  . Cardiomyopathy, ischemic 11/10/2012  . CAD (coronary artery disease) 07/02/2011  . Obesity 07/02/2011  . Hypothyroidism, postablative   . CARCINOMA, RENAL CELL 10/31/2008  . HYPOTHYROIDISM 10/31/2008  . DYSLIPIDEMIA 10/31/2008  . HYPERTENSION 10/31/2008  . MYOCARDIAL INFARCTION 10/31/2008   PMHx, Surg Hx, Soc Hx and Fam Hx reviewed. Medications reconciled.  ROS: As per HPI.  O: Filed Vitals:   05/16/13 1116  BP: 168/79  Pulse: 71  Temp: 98.2 F (36.8 C)  Resp: 16   GEN: In NAD; WN,WD. HENT: [place]/AT; EOMI w/ clear conj/sclerae. Wears corrective lenses. EACs/TMs unremarkable. Oroph clear and moist w/o lesions. Sinuses NT w/  percussion. NECK: Supple w/o LAN. No carotid bruits. COR: RRR. Normal S1 and S2. No m/g/r. No S4. SKIN: W&D; intact w/o erythema, rashes or pallor. MS: R foot- normal appearance. No joint effusion or deformity. FROM at ankle. No tenderness along metatarsals. Neurovascular intact. Bears weight w/o difficulty. NEURO: A&O x 3; CNs intact. Mild cognitive dysfunction- pt inattentive at times and problems w/ word-finding and coherent articulation of thoughts.  A/P: Tinnitus, bilateral - Suspect related to noise exposure. Known CAD, s/p CABG in 2007.  Pt has allergic rhinitis- continue medication and use other measures to relieve sinus congestion. Plan: Carotid duplex  Pain in joint, ankle and foot, right- Advised pt to get insoles for better foot/arch support; directed him to [employer].  HYPERTENSION - Stable; no medication change.  Plan: Basic metabolic panel  Paresthesia - Plan: Carotid duplex  Need for hepatitis C screening test - Plan: Hepatitis C antibody

## 2013-05-25 NOTE — Progress Notes (Signed)
Quick Note:  Your recent study of blood vessels in the neck (carotid arteries) showed very little plaque. The flow through these arteries is normal. The specialist who read the study suggests follow-up in 1 year. ______

## 2013-07-12 ENCOUNTER — Other Ambulatory Visit: Payer: Self-pay | Admitting: Family Medicine

## 2013-08-15 ENCOUNTER — Ambulatory Visit: Payer: Medicare Other | Admitting: Family Medicine

## 2013-08-15 ENCOUNTER — Encounter: Payer: Self-pay | Admitting: Family Medicine

## 2013-08-15 VITALS — BP 141/68 | HR 62 | Temp 98.1°F | Resp 16 | Ht 71.0 in | Wt 239.0 lb

## 2013-08-15 DIAGNOSIS — I10 Essential (primary) hypertension: Secondary | ICD-10-CM

## 2013-08-15 DIAGNOSIS — M2550 Pain in unspecified joint: Secondary | ICD-10-CM

## 2013-08-15 MED ORDER — TRAMADOL HCL 50 MG PO TABS: 50.0000 mg | ORAL_TABLET | Freq: Two times a day (BID) | ORAL | Status: DC | PRN

## 2013-08-15 NOTE — Progress Notes (Signed)
S:  This 68 y.o. Cauc male is here to review results of Carotid Dopplers. This test was ordered because pt c/o dizziness and has known CAD. Dizziness has not recurred. Pt has follow-up with Cardiologist within next 2 weeks. His main complaint is joint pain in knees and ankles, less so in other major joints. He inquires about possible gout. He takes BC powder w/ pain relief but would like a prescription pain medication because of concerns about taking multiple doses of this med. He used to be a runner; suggestions of other types of non-weight bearing exercises do not appeal to pt (aquatic aerobics for example).  Patient Active Problem List   Diagnosis Date Noted  . Hypertrophy of prostate without urinary obstruction and other lower urinary tract symptoms (LUTS) 11/26/2012  . Cardiomyopathy, ischemic 11/10/2012  . CAD (coronary artery disease) 07/02/2011  . Obesity 07/02/2011  . Hypothyroidism, postablative   . CARCINOMA, RENAL CELL 10/31/2008  . HYPOTHYROIDISM 10/31/2008  . DYSLIPIDEMIA 10/31/2008  . HYPERTENSION 10/31/2008  . MYOCARDIAL INFARCTION 10/31/2008   Prior to Admission medications   Medication Sig Start Date End Date Taking? Authorizing Provider  atorvastatin (LIPITOR) 20 MG tablet Take 20 mg by mouth daily.   Yes Historical Provider, MD  cholecalciferol (VITAMIN D) 1000 UNITS tablet Take 1,000 Units by mouth daily.   Yes Historical Provider, MD  desloratadine (CLARINEX) 5 MG tablet Take 1 tablet (5 mg total) by mouth daily. 03/30/13  Yes Barton Fanny, MD  levothyroxine (SYNTHROID) 112 MCG tablet TAKE 1 TABLET DAILY 05/21/13  Yes Mancel Bale, PA-C  metoprolol (LOPRESSOR) 50 MG tablet Take 1 tablet (50 mg total) by mouth 2 (two) times daily. 08/17/12  Yes Barton Fanny, MD  nitroGLYCERIN (NITROSTAT) 0.4 MG SL tablet Place 0.4 mg under the tongue every 5 (five) minutes as needed for chest pain.   Yes Historical Provider, MD  omeprazole (PRILOSEC) 20 MG capsule Take 20 mg by  mouth daily.   Yes Historical Provider, MD  ramipril (ALTACE) 10 MG tablet Take 1 tablet (10 mg total) by mouth 2 (two) times daily. 08/17/12  Yes Barton Fanny, MD  tamsulosin (FLOMAX) 0.4 MG CAPS capsule TAKE 1 CAPSULE DAILY 07/12/13  Yes Barton Fanny, MD  triamterene-hydrochlorothiazide (MAXZIDE-25) 37.5-25 MG per tablet Take 1 tablet by mouth daily.   Yes Historical Provider, MD          PMHx, Surg Hx, Soc and Fam Hx reviewed.  ROS: As per HPI.  O: Filed Vitals:   08/15/13 1119  BP: 141/68  Pulse: 62  Temp: 98.1 F (36.7 C)  Resp: 16   GEN: in NAD: WN,WD. HENT: [place]/AT; EOMI w/ clear conj/sclerae. Corrective lenses. Otherwise unremarkable. COR: RRR. LUNGS: Unlabored resp. SKIN: W&D; intact w/o erythema, jaundice, diaphoresis or pallor. NEURO: A&O x 3; CNs intact. Speech pattern- mild- moderate dysarthria noted.  CAROTID DUPLEX: Mild mixed plaque bilaterally (1-39%) ICA stenosis. Antegrade flow in both arteries.  A/P: Multiple joint pain- Trial Tramadol; advised non-weight bearing exercises.  HYPERTENSION- Stable and controlled on current medications.  Meds ordered this encounter  Medications         . traMADol (ULTRAM) 50 MG tablet    Sig: Take 1 tablet (50 mg total) by mouth every 12 (twelve) hours as needed.    Dispense:  50 tablet    Refill:  0

## 2013-09-03 ENCOUNTER — Ambulatory Visit: Payer: Medicare Other

## 2013-09-03 ENCOUNTER — Ambulatory Visit (INDEPENDENT_AMBULATORY_CARE_PROVIDER_SITE_OTHER): Payer: Medicare Other | Admitting: Internal Medicine

## 2013-09-03 VITALS — BP 158/84 | HR 52 | Temp 97.6°F | Resp 16 | Ht 70.75 in | Wt 233.3 lb

## 2013-09-03 DIAGNOSIS — M25569 Pain in unspecified knee: Secondary | ICD-10-CM

## 2013-09-03 DIAGNOSIS — M25561 Pain in right knee: Secondary | ICD-10-CM

## 2013-09-03 DIAGNOSIS — M1990 Unspecified osteoarthritis, unspecified site: Secondary | ICD-10-CM

## 2013-09-03 DIAGNOSIS — M25519 Pain in unspecified shoulder: Secondary | ICD-10-CM

## 2013-09-03 DIAGNOSIS — M25512 Pain in left shoulder: Secondary | ICD-10-CM

## 2013-09-03 DIAGNOSIS — M129 Arthropathy, unspecified: Secondary | ICD-10-CM

## 2013-09-03 LAB — POCT CBC
GRANULOCYTE PERCENT: 63.4 % (ref 37–80)
HEMATOCRIT: 52.5 % (ref 43.5–53.7)
Hemoglobin: 17.4 g/dL (ref 14.1–18.1)
Lymph, poc: 2.8 (ref 0.6–3.4)
MCH, POC: 31.6 pg — AB (ref 27–31.2)
MCHC: 33.1 g/dL (ref 31.8–35.4)
MCV: 95.4 fL (ref 80–97)
MID (cbc): 0.8 (ref 0–0.9)
MPV: 10.5 fL (ref 0–99.8)
PLATELET COUNT, POC: 349 10*3/uL (ref 142–424)
POC GRANULOCYTE: 6.2 (ref 2–6.9)
POC LYMPH %: 28.7 % (ref 10–50)
POC MID %: 7.9 % (ref 0–12)
RBC: 5.5 M/uL (ref 4.69–6.13)
RDW, POC: 13.2 %
WBC: 9.8 10*3/uL (ref 4.6–10.2)

## 2013-09-03 LAB — URIC ACID: Uric Acid, Serum: 4.4 mg/dL (ref 4.0–7.8)

## 2013-09-03 LAB — POCT SEDIMENTATION RATE: POCT SED RATE: 20 mm/h (ref 0–22)

## 2013-09-03 MED ORDER — TRAMADOL HCL 50 MG PO TABS: 50.0000 mg | ORAL_TABLET | Freq: Two times a day (BID) | ORAL | Status: DC | PRN

## 2013-09-03 NOTE — Patient Instructions (Signed)
Shoulder Exercises EXERCISES  RANGE OF MOTION (ROM) AND STRETCHING EXERCISES These exercises may help you when beginning to rehabilitate your injury. Your symptoms may resolve with or without further involvement from your physician, physical therapist or athletic trainer. While completing these exercises, remember:   Restoring tissue flexibility helps normal motion to return to the joints. This allows healthier, less painful movement and activity.  An effective stretch should be held for at least 30 seconds.  A stretch should never be painful. You should only feel a gentle lengthening or release in the stretched tissue. ROM - Pendulum  Bend at the waist so that your right / left arm falls away from your body. Support yourself with your opposite hand on a solid surface, such as a table or a countertop.  Your right / left arm should be perpendicular to the ground. If it is not perpendicular, you need to lean over farther. Relax the muscles in your right / left arm and shoulder as much as possible.  Gently sway your hips and trunk so they move your right / left arm without any use of your right / left shoulder muscles.  Progress your movements so that your right / left arm moves side to side, then forward and backward, and finally, both clockwise and counterclockwise.  Complete __________ repetitions in each direction. Many people use this exercise to relieve discomfort in their shoulder as well as to gain range of motion. Repeat __________ times. Complete this exercise __________ times per day. STRETCH  Flexion, Standing  Stand with good posture. With an underhand grip on your right / left hand and an overhand grip on the opposite hand, grasp a broomstick or cane so that your hands are a little more than shoulder-width apart.  Keeping your right / left elbow straight and shoulder muscles relaxed, push the stick with your opposite hand to raise your right / left arm in front of your body and  then overhead. Raise your arm until you feel a stretch in your right / left shoulder, but before you have increased shoulder pain.  Try to avoid shrugging your right / left shoulder as your arm rises by keeping your shoulder blade tucked down and toward your mid-back spine. Hold __________ seconds.  Slowly return to the starting position. Repeat __________ times. Complete this exercise __________ times per day. STRETCH - Internal Rotation  Place your right / left hand behind your back, palm-up.  Throw a towel or belt over your opposite shoulder. Grasp the towel/belt with your right / left hand.  While keeping an upright posture, gently pull up on the towel/belt until you feel a stretch in the front of your right / left shoulder.  Avoid shrugging your right / left shoulder as your arm rises by keeping your shoulder blade tucked down and toward your mid-back spine.  Hold __________. Release the stretch by lowering your opposite hand. Repeat __________ times. Complete this exercise __________ times per day. STRETCH - External Rotation and Abduction  Stagger your stance through a doorframe. It does not matter which foot is forward.  As instructed by your physician, physical therapist or athletic trainer, place your hands:  And forearms above your head and on the door frame.  And forearms at head-height and on the door frame.  At elbow-height and on the door frame.  Keeping your head and chest upright and your stomach muscles tight to prevent over-extending your low-back, slowly shift your weight onto your front foot until you feel   a stretch across your chest and/or in the front of your shoulders.  Hold __________ seconds. Shift your weight to your back foot to release the stretch. Repeat __________ times. Complete this stretch __________ times per day.  STRENGTHENING EXERCISES  These exercises may help you when beginning to rehabilitate your injury. They may resolve your symptoms with  or without further involvement from your physician, physical therapist or athletic trainer. While completing these exercises, remember:   Muscles can gain both the endurance and the strength needed for everyday activities through controlled exercises.  Complete these exercises as instructed by your physician, physical therapist or athletic trainer. Progress the resistance and repetitions only as guided.  You may experience muscle soreness or fatigue, but the pain or discomfort you are trying to eliminate should never worsen during these exercises. If this pain does worsen, stop and make certain you are following the directions exactly. If the pain is still present after adjustments, discontinue the exercise until you can discuss the trouble with your clinician.  If advised by your physician, during your recovery, avoid activity or exercises which involve actions that place your right / left hand or elbow above your head or behind your back or head. These positions stress the tissues which are trying to heal. STRENGTH - Scapular Depression and Adduction  With good posture, sit on a firm chair. Supported your arms in front of you with pillows, arm rests or a table top. Have your elbows in line with the sides of your body.  Gently draw your shoulder blades down and toward your mid-back spine. Gradually increase the tension without tensing the muscles along the top of your shoulders and the back of your neck.  Hold for __________ seconds. Slowly release the tension and relax your muscles completely before completing the next repetition.  After you have practiced this exercise, remove the arm support and complete it in standing as well as sitting. Repeat __________ times. Complete this exercise __________ times per day.  STRENGTH - External Rotators  Secure a rubber exercise band/tubing to a fixed object so that it is at the same height as your right / left elbow when you are standing or sitting on a  firm surface.  Stand or sit so that the secured exercise band/tubing is at your side that is not injured.  Bend your elbow 90 degrees. Place a folded towel or small pillow under your right / left arm so that your elbow is a few inches away from your side.  Keeping the tension on the exercise band/tubing, pull it away from your body, as if pivoting on your elbow. Be sure to keep your body steady so that the movement is only coming from your shoulder rotating.  Hold __________ seconds. Release the tension in a controlled manner as you return to the starting position. Repeat __________ times. Complete this exercise __________ times per day.  STRENGTH - Supraspinatus  Stand or sit with good posture. Grasp a __________ weight or an exercise band/tubing so that your hand is "thumbs-up," like when you shake hands.  Slowly lift your right / left hand from your thigh into the air, traveling about 30 degrees from straight out at your side. Lift your hand to shoulder height or as far as you can without increasing any shoulder pain. Initially, many people do not lift their hands above shoulder height.  Avoid shrugging your right / left shoulder as your arm rises by keeping your shoulder blade tucked down and toward your   mid-back spine.  Hold for __________ seconds. Control the descent of your hand as you slowly return to your starting position. Repeat __________ times. Complete this exercise __________ times per day.  STRENGTH - Shoulder Extensors  Secure a rubber exercise band/tubing so that it is at the height of your shoulders when you are either standing or sitting on a firm arm-less chair.  With a thumbs-up grip, grasp an end of the band/tubing in each hand. Straighten your elbows and lift your hands straight in front of you at shoulder height. Step back away from the secured end of band/tubing until it becomes tense.  Squeezing your shoulder blades together, pull your hands down to the sides of  your thighs. Do not allow your hands to go behind you.  Hold for __________ seconds. Slowly ease the tension on the band/tubing as you reverse the directions and return to the starting position. Repeat __________ times. Complete this exercise __________ times per day.  STRENGTH - Scapular Retractors  Secure a rubber exercise band/tubing so that it is at the height of your shoulders when you are either standing or sitting on a firm arm-less chair.  With a palm-down grip, grasp an end of the band/tubing in each hand. Straighten your elbows and lift your hands straight in front of you at shoulder height. Step back away from the secured end of band/tubing until it becomes tense.  Squeezing your shoulder blades together, draw your elbows back as you bend them. Keep your upper arm lifted away from your body throughout the exercise.  Hold __________ seconds. Slowly ease the tension on the band/tubing as you reverse the directions and return to the starting position. Repeat __________ times. Complete this exercise __________ times per day. STRENGTH  Scapular Depressors  Find a sturdy chair without wheels, such as a from a dining room table.  Keeping your feet on the floor, lift your bottom from the seat and lock your elbows.  Keeping your elbows straight, allow gravity to pull your body weight down. Your shoulders will rise toward your ears.  Raise your body against gravity by drawing your shoulder blades down your back, shortening the distance between your shoulders and ears. Although your feet should always maintain contact with the floor, your feet should progressively support less body weight as you get stronger.  Hold __________ seconds. In a controlled and slow manner, lower your body weight to begin the next repetition. Repeat __________ times. Complete this exercise __________ times per day.  Document Released: 04/14/2005 Document Revised: 08/23/2011 Document Reviewed: 09/12/2008 [facility]  Patient Information 2014 [place], [place]. Knee Exercises EXERCISES RANGE OF MOTION(ROM) AND STRETCHING EXERCISES These exercises may help you when beginning to rehabilitate your injury. Your symptoms may resolve with or without further involvement from your physician, physical therapist or athletic trainer. While completing these exercises, remember:   Restoring tissue flexibility helps normal motion to return to the joints. This allows healthier, less painful movement and activity.  An effective stretch should be held for at least 30 seconds.  A stretch should never be painful. You should only feel a gentle lengthening or release in the stretched tissue. STRETCH - Knee Extension, Prone  Lie on your stomach on a firm surface, such as a bed or countertop. Place your right / left knee and leg just beyond the edge of the surface. You may wish to place a towel under the far end of your right / left thigh for comfort.  Relax your leg muscles and allow  gravity to straighten your knee. Your clinician may advise you to add an ankle weight if more resistance is helpful for you.  You should feel a stretch in the back of your right / left knee. Hold this position for __________ seconds. Repeat __________ times. Complete this stretch __________ times per day. * Your physician, physical therapist or athletic trainer may ask you to add ankle weight to enhance your stretch.  RANGE OF MOTION - Knee Flexion, Active  Lie on your back with both knees straight. (If this causes back discomfort, bend your opposite knee, placing your foot flat on the floor.)  Slowly slide your heel back toward your buttocks until you feel a gentle stretch in the front of your knee or thigh.  Hold for __________ seconds. Slowly slide your heel back to the starting position. Repeat __________ times. Complete this exercise __________ times per day.  STRETCH - Quadriceps, Prone   Lie on your stomach on a firm surface, such as a  bed or padded floor.  Bend your right / left knee and grasp your ankle. If you are unable to reach, your ankle or pant leg, use a belt around your foot to lengthen your reach.  Gently pull your heel toward your buttocks. Your knee should not slide out to the side. You should feel a stretch in the front of your thigh and/or knee.  Hold this position for __________ seconds. Repeat __________ times. Complete this stretch __________ times per day.  STRETCH  Hamstrings, Supine   Lie on your back. Loop a belt or towel over the ball of your right / left foot.  Straighten your right / left knee and slowly pull on the belt to raise your leg. Do not allow the right / left knee to bend. Keep your opposite leg flat on the floor.  Raise the leg until you feel a gentle stretch behind your right / left knee or thigh. Hold this position for __________ seconds. Repeat __________ times. Complete this stretch __________ times per day.  STRENGTHENING EXERCISES These exercises may help you when beginning to rehabilitate your injury. They may resolve your symptoms with or without further involvement from your physician, physical therapist or athletic trainer. While completing these exercises, remember:   Muscles can gain both the endurance and the strength needed for everyday activities through controlled exercises.  Complete these exercises as instructed by your physician, physical therapist or athletic trainer. Progress the resistance and repetitions only as guided.  You may experience muscle soreness or fatigue, but the pain or discomfort you are trying to eliminate should never worsen during these exercises. If this pain does worsen, stop and make certain you are following the directions exactly. If the pain is still present after adjustments, discontinue the exercise until you can discuss the trouble with your clinician. STRENGTH - Quadriceps, Isometrics  Lie on your back with your right / left leg extended  and your opposite knee bent.  Gradually tense the muscles in the front of your right / left thigh. You should see either your knee cap slide up toward your hip or increased dimpling just above the knee. This motion will push the back of the knee down toward the floor/mat/bed on which you are lying.  Hold the muscle as tight as you can without increasing your pain for __________ seconds.  Relax the muscles slowly and completely in between each repetition. Repeat __________ times. Complete this exercise __________ times per day.  STRENGTH - Quadriceps, Short Arcs  Lie on your back. Place a __________ inch towel roll under your knee so that the knee slightly bends.  Raise only your lower leg by tightening the muscles in the front of your thigh. Do not allow your thigh to rise.  Hold this position for __________ seconds. Repeat __________ times. Complete this exercise __________ times per day.  OPTIONAL ANKLE WEIGHTS: Begin with ____________________, but DO NOT exceed ____________________. Increase in 1 pound/0.5 kilogram increments.  STRENGTH - Quadriceps, Straight Leg Raises  Quality counts! Watch for signs that the quadriceps muscle is working to insure you are strengthening the correct muscles and not "cheating" by substituting with healthier muscles.  Lay on your back with your right / left leg extended and your opposite knee bent.  Tense the muscles in the front of your right / left thigh. You should see either your knee cap slide up or increased dimpling just above the knee. Your thigh may even quiver.  Tighten these muscles even more and raise your leg 4 to 6 inches off the floor. Hold for __________ seconds.  Keeping these muscles tense, lower your leg.  Relax the muscles slowly and completely in between each repetition. Repeat __________ times. Complete this exercise __________ times per day.  STRENGTH - Hamstring, Curls  Lay on your stomach with your legs extended. (If you lay  on a bed, your feet may hang over the edge.)  Tighten the muscles in the back of your thigh to bend your right / left knee up to 90 degrees. Keep your hips flat on the bed/floor.  Hold this position for __________ seconds.  Slowly lower your leg back to the starting position. Repeat __________ times. Complete this exercise __________ times per day.  OPTIONAL ANKLE WEIGHTS: Begin with ____________________, but DO NOT exceed ____________________. Increase in 1 pound/0.5 kilogram increments.  STRENGTH  Quadriceps, Squats  Stand in a door frame so that your feet and knees are in line with the frame.  Use your hands for balance, not support, on the frame.  Slowly lower your weight, bending at the hips and knees. Keep your lower legs upright so that they are parallel with the door frame. Squat only within the range that does not increase your knee pain. Never let your hips drop below your knees.  Slowly return upright, pushing with your legs, not pulling with your hands. Repeat __________ times. Complete this exercise __________ times per day.  STRENGTH - Quadriceps, Wall Slides  Follow guidelines for form closely. Increased knee pain often results from poorly placed feet or knees.  Lean against a smooth wall or door and walk your feet out 18-24 inches. Place your feet hip-width apart.  Slowly slide down the wall or door until your knees bend __________ degrees.* Keep your knees over your heels, not your toes, and in line with your hips, not falling to either side.  Hold for __________ seconds. Stand up to rest for __________ seconds in between each repetition. Repeat __________ times. Complete this exercise __________ times per day. * Your physician, physical therapist or athletic trainer will alter this angle based on your symptoms and progress. Document Released: 04/14/2005 Document Revised: 08/23/2011 Document Reviewed: 09/12/2008 [facility] Patient Information 2014 [place],  [place]. Arthritis, Nonspecific Arthritis is inflammation of a joint. This usually means pain, redness, warmth or swelling are present. One or more joints may be involved. There are a number of types of arthritis. Your caregiver may not be able to tell what type of arthritis you have  right away. CAUSES  The most common cause of arthritis is the wear and tear on the joint (osteoarthritis). This causes damage to the cartilage, which can break down over time. The knees, hips, back and neck are most often affected by this type of arthritis. Other types of arthritis and common causes of joint pain include:  Sprains and other injuries near the joint. Sometimes minor sprains and injuries cause pain and swelling that develop hours later.  Rheumatoid arthritis. This affects hands, feet and knees. It usually affects both sides of your body at the same time. It is often associated with chronic ailments, fever, weight loss and general weakness.  Crystal arthritis. Gout and pseudo gout can cause occasional acute severe pain, redness and swelling in the foot, ankle, or knee.  Infectious arthritis. Bacteria can get into a joint through a break in overlying skin. This can cause infection of the joint. Bacteria and viruses can also spread through the blood and affect your joints.  Drug, infectious and allergy reactions. Sometimes joints can become mildly painful and slightly swollen with these types of illnesses. SYMPTOMS   Pain is the main symptom.  Your joint or joints can also be red, swollen and warm or hot to the touch.  You may have a fever with certain types of arthritis, or even feel overall ill.  The joint with arthritis will hurt with movement. Stiffness is present with some types of arthritis. DIAGNOSIS  Your caregiver will suspect arthritis based on your description of your symptoms and on your exam. Testing may be needed to find the type of arthritis:  Blood and sometimes urine tests.  X-ray  tests and sometimes CT or MRI scans.  Removal of fluid from the joint (arthrocentesis) is done to check for bacteria, crystals or other causes. Your caregiver (or a specialist) will numb the area over the joint with a local anesthetic, and use a needle to remove joint fluid for examination. This procedure is only minimally uncomfortable.  Even with these tests, your caregiver may not be able to tell what kind of arthritis you have. Consultation with a specialist (rheumatologist) may be helpful. TREATMENT  Your caregiver will discuss with you treatment specific to your type of arthritis. If the specific type cannot be determined, then the following general recommendations may apply. Treatment of severe joint pain includes:  Rest.  Elevation.  Anti-inflammatory medication (for example, ibuprofen) may be prescribed. Avoiding activities that cause increased pain.  Only take over-the-counter or prescription medicines for pain and discomfort as recommended by your caregiver.  Cold packs over an inflamed joint may be used for 10 to 15 minutes every hour. Hot packs sometimes feel better, but do not use overnight. Do not use hot packs if you are diabetic without your caregiver's permission.  A cortisone shot into arthritic joints may help reduce pain and swelling.  Any acute arthritis that gets worse over the next 1 to 2 days needs to be looked at to be sure there is no joint infection. Long-term arthritis treatment involves modifying activities and lifestyle to reduce joint stress jarring. This can include weight loss. Also, exercise is needed to nourish the joint cartilage and remove waste. This helps keep the muscles around the joint strong. HOME CARE INSTRUCTIONS   Do not take aspirin to relieve pain if gout is suspected. This elevates uric acid levels.  Only take over-the-counter or prescription medicines for pain, discomfort or fever as directed by your caregiver.  Rest the joint as  much as  possible.  If your joint is swollen, keep it elevated.  Use crutches if the painful joint is in your leg.  Drinking plenty of fluids may help for certain types of arthritis.  Follow your caregiver's dietary instructions.  Try low-impact exercise such as:  Swimming.  Water aerobics.  Biking.  Walking.  Morning stiffness is often relieved by a warm shower.  Put your joints through regular range-of-motion. SEEK MEDICAL CARE IF:   You do not feel better in 24 hours or are getting worse.  You have side effects to medications, or are not getting better with treatment. SEEK IMMEDIATE MEDICAL CARE IF:   You have a fever.  You develop severe joint pain, swelling or redness.  Many joints are involved and become painful and swollen.  There is severe back pain and/or leg weakness.  You have loss of bowel or bladder control. Document Released: 07/08/2004 Document Revised: 08/23/2011 Document Reviewed: 07/24/2008 [facility] Patient Information 2014 [place].

## 2013-09-03 NOTE — Progress Notes (Signed)
   Subjective:    Patient ID: Andre Frieze., male    DOB: [birth year], 68 y.o.   MRN: [id number]  HPI  68 y/o male, c/o joint pain-  knee, elbow, shoulder x years, progressing in severity.   States right leg gives away then catches before falling x 3-4 weeks. Does not cause any issue when running. Uses velcro weights, does not lock up. Runs apx. 10 miles in 2 hours.  Becoming more frequent.  Left shoulder goes limp as if there is no pressure x 2 weeks, intermittent. Has full function of left shoulder with no pain. Saw his primary care doc last week. Right knee and left shoulder have no weakness, numbness, or loss of function.  Has many joint aches off and on, no swelling or reddness any where. No hx of gout.  Continues to be very active   Review of Systems     Objective:   Physical Exam  Vitals reviewed. Constitutional: He is oriented to person, place, and time. He appears well-developed and well-nourished. No distress.  HENT:  Head: Normocephalic.  Eyes: EOM are normal.  Neck: Normal range of motion. Neck supple.  Pulmonary/Chest: Effort normal.  Musculoskeletal: Normal range of motion. He exhibits no edema and no tenderness.       Left shoulder: He exhibits normal range of motion, no tenderness, no bony tenderness, no swelling, no effusion, no deformity, no pain, no spasm, normal pulse and normal strength.       Right knee: He exhibits normal range of motion, no swelling, no effusion, no deformity, no erythema, normal alignment, no LCL laxity, normal patellar mobility, no bony tenderness, normal meniscus and no MCL laxity. No tenderness found. No medial joint line, no lateral joint line, no MCL, no LCL and no patellar tendon tenderness noted.  Neurological: He is alert and oriented to person, place, and time. No cranial nerve deficit. He exhibits normal muscle tone. Coordination normal.  Psychiatric: He has a normal mood and affect. His behavior is normal. Judgment and thought  content normal.    [facility] Policy for Prescribing Controlled Substances (Revised 04/2012) 1. Prescriptions for controlled substances will be filled by ONE provider at [facility] with whom you have established and developed a plan for your care, including follow-up. 2. You are encouraged to schedule an appointment with your prescriber at our appointment center for follow-up visits whenever possible. 3. If you request a prescription for the controlled substance while at [facility] for an acute problem (with someone other than your regular prescriber), you MAY be given a ONE-TIME prescription for a 30-day supply of the controlled substance, to allow time for you to return to see your regular prescriber for additional prescriptions  [facility] reading (PRIMARY) by  Dr Elder Cyphers mild DJD knee, normal shoulder xr      Assessment & Plan:  left shoulder x-ray Right knee x-ray  Pain/Giving way left shoulder/right knee  Exercises/Tylenol/Tramadol

## 2013-09-09 ENCOUNTER — Other Ambulatory Visit: Payer: Self-pay | Admitting: Family Medicine

## 2013-09-26 ENCOUNTER — Other Ambulatory Visit: Payer: Self-pay | Admitting: Internal Medicine

## 2013-09-29 ENCOUNTER — Ambulatory Visit (INDEPENDENT_AMBULATORY_CARE_PROVIDER_SITE_OTHER): Payer: Medicare Other | Admitting: Emergency Medicine

## 2013-09-29 VITALS — BP 132/80 | HR 61 | Temp 98.0°F | Resp 17 | Ht 71.5 in | Wt 233.0 lb

## 2013-09-29 DIAGNOSIS — K625 Hemorrhage of anus and rectum: Secondary | ICD-10-CM

## 2013-09-29 DIAGNOSIS — M25512 Pain in left shoulder: Secondary | ICD-10-CM

## 2013-09-29 DIAGNOSIS — M25519 Pain in unspecified shoulder: Secondary | ICD-10-CM

## 2013-09-29 DIAGNOSIS — M25561 Pain in right knee: Secondary | ICD-10-CM

## 2013-09-29 DIAGNOSIS — M25569 Pain in unspecified knee: Secondary | ICD-10-CM

## 2013-09-29 DIAGNOSIS — M129 Arthropathy, unspecified: Secondary | ICD-10-CM

## 2013-09-29 DIAGNOSIS — M1990 Unspecified osteoarthritis, unspecified site: Secondary | ICD-10-CM

## 2013-09-29 DIAGNOSIS — M549 Dorsalgia, unspecified: Secondary | ICD-10-CM

## 2013-09-29 LAB — IFOBT (OCCULT BLOOD): IMMUNOLOGICAL FECAL OCCULT BLOOD TEST: POSITIVE

## 2013-09-29 MED ORDER — TRAMADOL HCL 50 MG PO TABS: 50.0000 mg | ORAL_TABLET | Freq: Two times a day (BID) | ORAL | Status: DC | PRN

## 2013-09-29 MED ORDER — HYDROCORTISONE ACETATE 30 MG RE SUPP: 1.0000 | Freq: Two times a day (BID) | RECTAL | Status: DC

## 2013-09-29 NOTE — Progress Notes (Signed)
Subjective:    Patient ID: Andre Cook., male    DOB: August 23, 1945, 68 y.o.   MRN: [id number]  Rectal Bleeding  Pertinent negatives include no abdominal pain.   Chief Complaint  Patient presents with  . Rectal Bleeding  . Medication Refill   This chart was scribed for [name], MD by [name], ED Scribe. This patient was seen in room 14 and the patient's care was started at 9:55 AM.  HPI Comments: Andre Cook. is a 68 y.o. male who presents to the [facility] complaining of rectal bleeding onset 1 day ago with associated constipation. Pt reports that this is a new issue. Pt reports that after using the restroom he notices bright red blood when wiping. Pt reports colonoscopy 10 years ago and his next exam is in 2017.He denies abdominal pain and muscle pain. Pt denies soreness. Pt reports scar on abdomen is due to appendicitis. He also reports samples taken from his kidneys in the past due to possible cancer. Pt denies cancer.    Pt is also request a medication refill of pain killers. Pt reports that he has arthritis. Pt reports that  He is usually seen by Dr. Elder Cyphers and [name]. Pt reports that he takes the medication once every 12 hours.   Past Medical History  Diagnosis Date  . 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 Hypertension   . CAD (coronary artery disease) 2002    Post- op MI, CABG due to severe 3 vessel disease.  See cardiac cath report 02/02/2001  . Nephrolithiasis   . Dyslipidemia   . GERD (gastroesophageal reflux disease)   . Hyperglycemia   . Obesity   . MI (myocardial infarction) 2002    post- op 2002, severe 3 vessel disease, CABG.  Inferior wall MI  . 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 [name]  . Anxiety   . Heart disease   . Hypercholesterolemia   . Hypertrophy of prostate with urinary obstruction and other lower urinary tract symptoms (LUTS)    Allergies  Allergen Reactions    . Ciprofloxacin Rash   Prior to Admission medications   Medication Sig Start Date End Date Taking? Authorizing Provider  atorvastatin (LIPITOR) 20 MG tablet Take 20 mg by mouth daily.   Yes Historical Provider, MD  cholecalciferol (VITAMIN D) 1000 UNITS tablet Take 1,000 Units by mouth daily.   Yes Historical Provider, MD  desloratadine (CLARINEX) 5 MG tablet Take 1 tablet (5 mg total) by mouth daily. 03/30/13  Yes Barton Fanny, MD  levothyroxine (SYNTHROID) 112 MCG tablet TAKE 1 TABLET DAILY 05/21/13  Yes Mancel Bale, PA-C  metoprolol (LOPRESSOR) 50 MG tablet Take 1 tablet (50 mg total) by mouth 2 (two) times daily. 08/17/12  Yes Barton Fanny, MD  nitroGLYCERIN (NITROSTAT) 0.4 MG SL tablet Place 0.4 mg under the tongue every 5 (five) minutes as needed for chest pain.   Yes Historical Provider, MD  omeprazole (PRILOSEC) 20 MG capsule Take 20 mg by mouth daily.   Yes Historical Provider, MD  ramipril (ALTACE) 10 MG capsule TAKE 1 CAPSULE TWICE DAILY   Yes Barton Fanny, MD  tamsulosin (FLOMAX) 0.4 MG CAPS capsule TAKE 1 CAPSULE DAILY 07/12/13  Yes Barton Fanny, MD  traMADol (ULTRAM) 50 MG tablet Take 1 tablet (50 mg total) by mouth every 12 (twelve) hours as needed. 09/03/13  Yes Gerald Stabs  Hali Marry, MD  triamterene-hydrochlorothiazide (MAXZIDE-25) 37.5-25 MG per tablet Take 1 tablet by mouth daily.   Yes Historical Provider, MD   Review of Systems  Gastrointestinal: Positive for constipation, hematochezia and anal bleeding. Negative for abdominal pain.      Objective:   Physical Exam CONSTITUTIONAL: Well developed/well nourished HEAD: Normocephalic/atraumatic EYES: EOMI/PERRL ENMT: Mucous membranes moist NECK: supple no meningeal signs SPINE:entire spine nontender CV: S1/S2 noted, no murmurs/rubs/gallops noted LUNGS: Lungs are clear to auscultation bilaterally, no apparent distress ABDOMEN: soft, nontender, no rebound or guarding, 2 vertical scars on abdomen,  GU:no  cva tenderness, symmetrically  enlarged prostate. No mass on rectal exam. Half cm palpable hemorrhoid.   NEURO: Pt is awake/alert, moves all extremitiesx4 EXTREMITIES: pulses normal, full ROM SKIN: warm, color normal PSYCH: no abnormalities of mood noted Results for orders placed in visit on 09/29/13  IFOBT (OCCULT BLOOD)      Result Value Ref Range   IFOBT Positive        Assessment & Plan:  Positive stool and rectal bleeding probably secondary to perianal disease. He was given Anusol-HC cream to use twice a day. His Travenol was refilled and he was up sized to follow up with [name] at 104. Referral made back to Dr. Deatra Ina for reevaluation. I personally performed the services described in this documentation, which was scribed in my presence. The recorded information has been reviewed and is accurate.

## 2013-09-29 NOTE — Patient Instructions (Signed)
Hemorrhoids Hemorrhoids are swollen veins around the rectum or anus. There are two types of hemorrhoids:   Internal hemorrhoids. These occur in the veins just inside the rectum. They may poke through to the outside and become irritated and painful.  External hemorrhoids. These occur in the veins outside the anus and can be felt as a painful swelling or hard lump near the anus. CAUSES  Pregnancy.   Obesity.   Constipation or diarrhea.   Straining to have a bowel movement.   Sitting for long periods on the toilet.  Heavy lifting or other activity that caused you to strain.  Anal intercourse. SYMPTOMS   Pain.   Anal itching or irritation.   Rectal bleeding.   Fecal leakage.   Anal swelling.   One or more lumps around the anus.  DIAGNOSIS  Your caregiver may be able to diagnose hemorrhoids by visual examination. Other examinations or tests that may be performed include:   Examination of the rectal area with a gloved hand (digital rectal exam).   Examination of anal canal using a small tube (scope).   A blood test if you have lost a significant amount of blood.  A test to look inside the colon (sigmoidoscopy or colonoscopy). TREATMENT Most hemorrhoids can be treated at home. However, if symptoms do not seem to be getting better or if you have a lot of rectal bleeding, your caregiver may perform a procedure to help make the hemorrhoids get smaller or remove them completely. Possible treatments include:   Placing a rubber band at the base of the hemorrhoid to cut off the circulation (rubber band ligation).   Injecting a chemical to shrink the hemorrhoid (sclerotherapy).   Using a tool to burn the hemorrhoid (infrared light therapy).   Surgically removing the hemorrhoid (hemorrhoidectomy).   Stapling the hemorrhoid to block blood flow to the tissue (hemorrhoid stapling).  HOME CARE INSTRUCTIONS   Eat foods with fiber, such as whole grains, beans,  nuts, fruits, and vegetables. Ask your doctor about taking products with added fiber in them (fibersupplements).  Increase fluid intake. Drink enough water and fluids to keep your urine clear or pale yellow.   Exercise regularly.   Go to the bathroom when you have the urge to have a bowel movement. Do not wait.   Avoid straining to have bowel movements.   Keep the anal area dry and clean. Use wet toilet paper or moist towelettes after a bowel movement.   Medicated creams and suppositories may be used or applied as directed.   Only take over-the-counter or prescription medicines as directed by your caregiver.   Take warm sitz baths for 15 20 minutes, 3 4 times a day to ease pain and discomfort.   Place ice packs on the hemorrhoids if they are tender and swollen. Using ice packs between sitz baths may be helpful.   Put ice in a plastic bag.   Place a towel between your skin and the bag.   Leave the ice on for 15 20 minutes, 3 4 times a day.   Do not use a donut-shaped pillow or sit on the toilet for long periods. This increases blood pooling and pain.  SEEK MEDICAL CARE IF:  You have increasing pain and swelling that is not controlled by treatment or medicine.  You have uncontrolled bleeding.  You have difficulty or you are unable to have a bowel movement.  You have pain or inflammation outside the area of the hemorrhoids. MAKE SURE YOU:    Understand these instructions.  Will watch your condition.  Will get help right away if you are not doing well or get worse. Document Released: 05/28/2000 Document Revised: 05/17/2012 Document Reviewed: 04/04/2012 [employer] Patient Information 2014 [employer].  

## 2013-10-19 ENCOUNTER — Ambulatory Visit (INDEPENDENT_AMBULATORY_CARE_PROVIDER_SITE_OTHER): Payer: Medicare Other | Admitting: Family Medicine

## 2013-10-19 VITALS — BP 128/72 | HR 63 | Temp 97.6°F | Resp 18 | Ht 72.0 in | Wt 240.0 lb

## 2013-10-19 DIAGNOSIS — M25569 Pain in unspecified knee: Secondary | ICD-10-CM

## 2013-10-19 DIAGNOSIS — K5900 Constipation, unspecified: Secondary | ICD-10-CM | POA: Diagnosis not present

## 2013-10-19 DIAGNOSIS — M129 Arthropathy, unspecified: Secondary | ICD-10-CM

## 2013-10-19 DIAGNOSIS — M1990 Unspecified osteoarthritis, unspecified site: Secondary | ICD-10-CM

## 2013-10-19 DIAGNOSIS — M25562 Pain in left knee: Secondary | ICD-10-CM

## 2013-10-19 DIAGNOSIS — M25561 Pain in right knee: Secondary | ICD-10-CM

## 2013-10-19 DIAGNOSIS — M25512 Pain in left shoulder: Secondary | ICD-10-CM

## 2013-10-19 MED ORDER — POLYETHYLENE GLYCOL 3350 17 GM/SCOOP PO POWD: 17.0000 g | Freq: Once | ORAL | Status: DC

## 2013-10-19 MED ORDER — TRAMADOL HCL 50 MG PO TABS: 50.0000 mg | ORAL_TABLET | Freq: Two times a day (BID) | ORAL | Status: DC | PRN

## 2013-10-19 NOTE — Patient Instructions (Signed)

## 2013-10-19 NOTE — Progress Notes (Signed)
Patient ID: Andre Frieze., male   DOB: December 03, 1945, 68 y.o.   MRN: [id number]   This chart was scribed for Wardell Honour, MD by Marcha Dutton, ED Scribe. This patient was seen in room 11 and the patient's care was started at 1:46 PM.  Subjective:    Patient ID: Andre Frieze., male    DOB: [birth year], 68 y.o.   MRN: [id number]   10/19/2013   Constipation and rx refills   HPI HPI Comments: Seen by Dr. Everlene Farrier 09/29/2013 for constipation and rectal bleeding associated with wiping. Pt's last colonscopy was in 2007  Andre Cook. is a 68 y.o. male who presents to the [facility] complaining of constipation that began four weeks ago. Pt reports he is taking some suppositories and states he is no longer having blood with wiping. Pt has not yet had an appointment with Dr. Deatra Ina. He reports being under the impression Dr. Everlene Farrier would make the appointment for him. Pt states he has quit exercising because he has felt "too old." He states he was weightlifting and running every day for several years, but he came down with bronchitis in 2013 and has since been unable to reestablish a routine. Pt denies using any OTC stool softeners. Pt reports he is drinking plenty of water. Pt denies melena, nausea, vomiting, swelling of the lower extremities, and abdominal pain.  Pt reports 2-3 BMs a week. He states he previously had gone every day. He state sometimes after working in the yard he gets worn out. He states he becomes SOB, clammy, and sweaty. He states he occasionally has similar symptoms while sitting even inside.   Pt states he also quit exercising because of his joint pains. He states he is prescribed 1 tramadol every 12 hours. He states the dosing is too far apart and it works better for him to take .5 a pill every 6 hours. He states he's been taking tramadol for 3 months. He takes [facility] powder infrequently with inconsistent relief. He states he has had knee pain bilaterally  gives him the most trouble. He also reports his feet hurt him as well. Pt states his right knee "goes limp" intermittently usually when he's running. Pt reports his shoulders haven't been hurting him as much recently.  Pt states he needs a refill of all his medicines as well. He reports his next appointment with [name] is next month. He reports he last saw his cardiologist a couple months ago.    Review of Systems  Constitutional: Negative for fever, chills, diaphoresis and activity change.  HENT: Negative for congestion, ear pain, hearing loss, rhinorrhea, sore throat and tinnitus.   Eyes: Negative for pain and visual disturbance.  Respiratory: Positive for shortness of breath. Negative for cough, chest tightness and wheezing.   Cardiovascular: Negative for chest pain and leg swelling.  Gastrointestinal: Positive for nausea and constipation. Negative for vomiting, abdominal pain, diarrhea, blood in stool, anal bleeding and rectal pain.  Genitourinary: Negative for dysuria, hematuria and flank pain.  Musculoskeletal: Positive for arthralgias and myalgias. Negative for back pain, joint swelling and neck pain.  Skin: Negative for rash.  Neurological: Positive for light-headedness. Negative for dizziness, weakness, numbness and headaches.    Past Medical History  Diagnosis Date  . 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 Hypertension   . CAD (coronary artery disease) 2002    Post- op MI, CABG due to  severe 3 vessel disease.  See cardiac cath report 02/02/2001  . Nephrolithiasis   . Dyslipidemia   . GERD (gastroesophageal reflux disease)   . Hyperglycemia   . Obesity   . MI (myocardial infarction) 2002    post- op 2002, severe 3 vessel disease, CABG.  Inferior wall MI  . 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 [name]  . Anxiety   . Heart disease   . Hypercholesterolemia   . Hypertrophy of prostate with urinary obstruction and other lower  urinary tract symptoms (LUTS)   . Allergy   . Arthritis    Past Surgical History  Procedure Laterality Date  . Appendectomy    . Coronary artery bypass graft  2007    4 vessels treated  . Tonsillectomy and adenoidectomy    . Finger amputation      right 4th finger: industrial accident  . 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 Renal biopsy    . Cardiac surgery    . Thyroidectomy       Allergies  Allergen Reactions  . Ciprofloxacin Rash    Current Outpatient Prescriptions  Medication Sig Dispense Refill  . acetaminophen (TYLENOL) 325 MG tablet Take 325 mg by mouth every 6 (six) hours as needed.    Marland Kitchen atorvastatin (LIPITOR) 20 MG tablet Take 20 mg by mouth daily.    . cholecalciferol (VITAMIN D) 1000 UNITS tablet Take 1,000 Units by mouth daily.    . diphenhydrAMINE (BENADRYL) 25 mg capsule Take 25 mg by mouth every 6 (six) hours as needed.    . metoprolol (LOPRESSOR) 50 MG tablet Take 1 tablet (50 mg total) by mouth 2 (two) times daily. 180 tablet 3  . omeprazole (PRILOSEC) 20 MG capsule Take 20 mg by mouth daily.    Marland Kitchen triamterene-hydrochlorothiazide (MAXZIDE-25) 37.5-25 MG per tablet Take 1 tablet by mouth daily.    . Aspirin-Salicylamide-Caffeine (BC HEADACHE POWDER PO) Take by mouth.    . desloratadine (CLARINEX) 5 MG tablet TAKE 1 TABLET DAILY 30 tablet 6  . meloxicam (MOBIC) 15 MG tablet Take 15 mg by mouth daily.    . polyethylene glycol powder (GLYCOLAX/MIRALAX) powder Take 17 g by mouth once. Mix in 8 ounces of any liquid. 3350 g 1  . ramipril (ALTACE) 10 MG capsule TAKE 1 CAPSULE TWICE DAILY 180 capsule 0  . SYNTHROID 112 MCG tablet TAKE 1 TABLET DAILY 90 tablet 0  . tamsulosin (FLOMAX) 0.4 MG CAPS capsule Take 1 capsule (0.4 mg total) by mouth daily. 30 capsule 3  . traMADol (ULTRAM) 50 MG tablet Take 1 tablet (50 mg total) by mouth every 8 (eight) hours as needed. 90 tablet 2   No current facility-administered medications for this visit.         Objective:     Triage Vitals: BP 128/72  Pulse 63  Temp(Src) 97.6 F (36.4 C) (Oral)  Resp 18  Ht 6' (1.829 m)  Wt 240 lb (108.863 kg)  BMI 32.54 kg/m2  SpO2 95%   Physical Exam  Nursing note and vitals reviewed. Constitutional: He is oriented to person, place, and time. He appears well-developed and well-nourished. No distress.  HENT:  Head: Normocephalic and atraumatic.  Right Ear: External ear normal.  Left Ear: External ear normal.  Nose: Nose normal.  Mouth/Throat: No oropharyngeal exudate.  Eyes: Conjunctivae and EOM are normal. Pupils are equal, round, and reactive to light. Right eye exhibits no discharge. Left  eye exhibits no discharge. No scleral icterus.  Neck: Normal range of motion. Neck supple. No tracheal deviation present.  Cardiovascular: Normal rate, regular rhythm, normal heart sounds and intact distal pulses.  Exam reveals no gallop and no friction rub.   No murmur heard. Pulmonary/Chest: Effort normal and breath sounds normal. No stridor. No respiratory distress. He has no wheezes. He has no rales.  Abdominal: Soft. Bowel sounds are normal. He exhibits no distension. There is no tenderness. There is no rebound and no guarding.  Musculoskeletal: Normal range of motion. He exhibits no edema and no tenderness.  Knees without swelling  Neurological: He is alert and oriented to person, place, and time. He has normal strength. No cranial nerve deficit (no facial droop, extraocular movements intact, no slurred speech) or sensory deficit. He exhibits normal muscle tone. He displays no seizure activity. Coordination normal.  Skin: Skin is warm and dry. No rash noted. He is not diaphoretic.  Psychiatric: He has a normal mood and affect. His behavior is normal.    Results for orders placed or performed in visit on 09/29/13  IFOBT POC (occult bld, rslt in office)  Result Value Ref Range   IFOBT Positive       Assessment & Plan:   1. Unspecified  constipation   2. Knee pain, bilateral   3. Left shoulder pain   4. Right knee pain   5. Arthritis pain     1. Constipation: persistent; recommend starting Miralax or Colace daily; also recommend increasing water intake and fiber intake.  Will confirm appointment with Dr. Deatra Ina of GI; will place referral again. 2.  B knee pain: chronic issue for patient; refill of tramadol provided today.  Refer to ortho. 3.  L shoulder pain: chronic issue.  Recommend regular stretches daily.  Refer to ortho. 4.  Arthralgias: New.  Interfering with quality of life; requiring daily Tramadol; warrants autoimmune labs to evaluate for RA or SLE; consider rheumatology referral.  Refer to ortho to evaluate initially.   Meds ordered this encounter  Medications  . acetaminophen (TYLENOL) 325 MG tablet    Sig: Take 325 mg by mouth every 6 (six) hours as needed.  . diphenhydrAMINE (BENADRYL) 25 mg capsule    Sig: Take 25 mg by mouth every 6 (six) hours as needed.  Marland Kitchen DISCONTD: traMADol (ULTRAM) 50 MG tablet    Sig: Take 1 tablet (50 mg total) by mouth every 12 (twelve) hours as needed.    Dispense:  60 tablet    Refill:  1  . polyethylene glycol powder (GLYCOLAX/MIRALAX) powder    Sig: Take 17 g by mouth once. Mix in 8 ounces of any liquid.    Dispense:  3350 g    Refill:  1    No Follow-up on file.    I personally performed the services described in this documentation, which was scribed in my presence.  The recorded information has been reviewed and is accurate.  Reginia Forts, M.D.  [facility] [address] [place], [place]  [address] [phone] phone [phone] fax

## 2013-10-21 ENCOUNTER — Other Ambulatory Visit: Payer: Self-pay | Admitting: Family Medicine

## 2013-10-25 ENCOUNTER — Encounter: Payer: Self-pay | Admitting: Gastroenterology

## 2013-11-10 ENCOUNTER — Other Ambulatory Visit: Payer: Self-pay | Admitting: Family Medicine

## 2013-11-12 NOTE — Telephone Encounter (Signed)
Synthroid and Tamsulosin refilled for 90 days.

## 2013-11-12 NOTE — Telephone Encounter (Signed)
Pt has appt 12/06/13. OK to give 90 day RFs?

## 2013-12-06 ENCOUNTER — Ambulatory Visit (INDEPENDENT_AMBULATORY_CARE_PROVIDER_SITE_OTHER): Payer: Medicare Other

## 2013-12-06 ENCOUNTER — Ambulatory Visit (INDEPENDENT_AMBULATORY_CARE_PROVIDER_SITE_OTHER): Payer: Medicare Other | Admitting: Family Medicine

## 2013-12-06 ENCOUNTER — Encounter: Payer: Self-pay | Admitting: Family Medicine

## 2013-12-06 VITALS — BP 138/73 | HR 49 | Temp 97.1°F | Resp 20 | Ht 72.0 in | Wt 243.0 lb

## 2013-12-06 DIAGNOSIS — M1990 Unspecified osteoarthritis, unspecified site: Secondary | ICD-10-CM

## 2013-12-06 DIAGNOSIS — M170 Bilateral primary osteoarthritis of knee: Secondary | ICD-10-CM

## 2013-12-06 DIAGNOSIS — E039 Hypothyroidism, unspecified: Secondary | ICD-10-CM

## 2013-12-06 DIAGNOSIS — M1710 Unilateral primary osteoarthritis, unspecified knee: Secondary | ICD-10-CM

## 2013-12-06 DIAGNOSIS — M129 Arthropathy, unspecified: Secondary | ICD-10-CM

## 2013-12-06 DIAGNOSIS — Z85528 Personal history of other malignant neoplasm of kidney: Secondary | ICD-10-CM

## 2013-12-06 DIAGNOSIS — I10 Essential (primary) hypertension: Secondary | ICD-10-CM

## 2013-12-06 DIAGNOSIS — Z0000 Encounter for general adult medical examination without abnormal findings: Secondary | ICD-10-CM

## 2013-12-06 DIAGNOSIS — E785 Hyperlipidemia, unspecified: Secondary | ICD-10-CM

## 2013-12-06 LAB — THYROID PANEL WITH TSH
FREE THYROXINE INDEX: 3.8 (ref 1.0–3.9)
T3 Uptake: 34.1 % (ref 22.5–37.0)
T4, Total: 11.2 ug/dL (ref 5.0–12.5)
TSH: 2.188 u[IU]/mL (ref 0.350–4.500)

## 2013-12-06 LAB — LIPID PANEL
CHOL/HDL RATIO: 4.5 ratio
CHOLESTEROL: 141 mg/dL (ref 0–200)
HDL: 31 mg/dL — AB (ref >39)
LDL CALC: 67 mg/dL (ref 0–99)
TRIGLYCERIDES: 216 mg/dL — AB (ref <150)
VLDL: 43 mg/dL — AB (ref 0–40)

## 2013-12-06 LAB — COMPLETE METABOLIC PANEL WITH GFR
ALK PHOS: 78 U/L (ref 39–117)
ALT: 27 U/L (ref 0–53)
AST: 23 U/L (ref 0–37)
Albumin: 4.5 g/dL (ref 3.5–5.2)
BILIRUBIN TOTAL: 1 mg/dL (ref 0.2–1.2)
BUN: 26 mg/dL — AB (ref 6–23)
CHLORIDE: 100 meq/L (ref 96–112)
CO2: 25 mEq/L (ref 19–32)
CREATININE: 1.1 mg/dL (ref 0.50–1.35)
Calcium: 9.9 mg/dL (ref 8.4–10.5)
GFR, Est African American: 80 mL/min
GFR, Est Non African American: 70 mL/min
Glucose, Bld: 90 mg/dL (ref 70–99)
Potassium: 4.2 mEq/L (ref 3.5–5.3)
Sodium: 135 mEq/L (ref 135–145)
Total Protein: 7.4 g/dL (ref 6.0–8.3)

## 2013-12-06 MED ORDER — TRAMADOL HCL 50 MG PO TABS: 50.0000 mg | ORAL_TABLET | Freq: Two times a day (BID) | ORAL | Status: DC | PRN

## 2013-12-06 NOTE — Progress Notes (Signed)
Subjective:    Patient ID: Andre Cook., male    DOB: Mar 16, 1946, 68 y.o.   MRN: [id number]  HPI  This 68 y.o. 68 male is here for Annual Subsequent MCR exam. Pt has ongoing cardiology assessment with Dr. Einar Gip; he had nocturnal oximetry as part of sleep apnea testing and results was "normal " per pt. He reports compliance w/ all medications. He reports he has a fitness coach assigned to him through his insurer.  Pt has hx of Renal Cell Carcinoma, followed in past by Dr. Risa Grill at [facility]; pt states he has not had appt there in > 1 year and does not see the need to see specialist.  HCM: CRS- Current; next due in 2017.           IMM- Current.  Patient Active Problem List   Diagnosis Date Noted  . Hypertrophy of prostate without urinary obstruction and other lower urinary tract symptoms (LUTS) 11/26/2012  . Cardiomyopathy, ischemic 11/10/2012  . CAD (coronary artery disease) 07/02/2011  . Obesity 07/02/2011  . Hypothyroidism, postablative   . CARCINOMA, RENAL CELL 10/31/2008  . HYPOTHYROIDISM 10/31/2008  . DYSLIPIDEMIA 10/31/2008  . HYPERTENSION 10/31/2008  . MYOCARDIAL INFARCTION 10/31/2008   Prior to Admission medications   Medication Sig Start Date End Date Taking? Authorizing Provider  acetaminophen (TYLENOL) 325 MG tablet Take 325 mg by mouth every 6 (six) hours as needed.   Yes Historical Provider, MD  atorvastatin (LIPITOR) 20 MG tablet Take 20 mg by mouth daily.   Yes Historical Provider, MD  cholecalciferol (VITAMIN D) 1000 UNITS tablet Take 1,000 Units by mouth daily.   Yes Historical Provider, MD  desloratadine (CLARINEX) 5 MG tablet TAKE 1 TABLET DAILY   Yes Barton Fanny, MD  diphenhydrAMINE (BENADRYL) 25 mg capsule Take 25 mg by mouth every 6 (six) hours as needed.   Yes Historical Provider, MD  HYDROCORTISONE ACE, RECTAL, 30 MG SUPP Place 1 suppository (30 mg total) rectally 2 (two) times daily. 09/29/13  Yes [name], MD  meloxicam (MOBIC) 15 MG  tablet Take 15 mg by mouth daily.   Yes Historical Provider, MD  metoprolol (LOPRESSOR) 50 MG tablet Take 1 tablet (50 mg total) by mouth 2 (two) times daily. 08/17/12  Yes Barton Fanny, MD  nitroGLYCERIN (NITROSTAT) 0.4 MG SL tablet Place 0.4 mg under the tongue every 5 (five) minutes as needed for chest pain.   Yes Historical Provider, MD  omeprazole (PRILOSEC) 20 MG capsule Take 20 mg by mouth daily.   Yes Historical Provider, MD  polyethylene glycol powder (GLYCOLAX/MIRALAX) powder Take 17 g by mouth once. Mix in 8 ounces of any liquid. 10/19/13  Yes Wardell Honour, MD  ramipril (ALTACE) 10 MG capsule TAKE 1 CAPSULE TWICE DAILY   Yes Barton Fanny, MD  SYNTHROID 112 MCG tablet TAKE 1 TABLET DAILY   Yes Barton Fanny, MD  tamsulosin (FLOMAX) 0.4 MG CAPS capsule TAKE 1 CAPSULE DAILY   Yes Barton Fanny, MD  traMADol (ULTRAM) 50 MG tablet Take 1 tablet (50 mg total) by mouth every 12 (twelve) hours as needed.   Yes Barton Fanny, MD  triamterene-hydrochlorothiazide (MAXZIDE-25) 37.5-25 MG per tablet Take 1 tablet by mouth daily.   Yes Historical Provider, MD   PMHx, Surg Hx, Soc and Fam Hx reviewed.   Review of Systems  Constitutional: Negative.   HENT: Negative.   Eyes: Negative.        Has annual  eye exam with specialist.  Respiratory: Negative for apnea, cough, chest tightness, shortness of breath and wheezing.   Cardiovascular: Negative.   Gastrointestinal: Negative.   Endocrine: Negative.   Genitourinary: Negative.   Musculoskeletal: Positive for arthralgias. Negative for back pain, gait problem and myalgias.  Skin: Negative.   Allergic/Immunologic: Negative.   Neurological: Negative.   Hematological: Negative.   Psychiatric/Behavioral: Positive for decreased concentration. Negative for hallucinations, sleep disturbance and agitation. The patient is not nervous/anxious.       Objective:   Physical Exam  Nursing note and vitals  reviewed. Constitutional: He is oriented to person, place, and time. Vital signs are normal. He appears well-developed and well-nourished. No distress.  HENT:  Head: Normocephalic and atraumatic.  Right Ear: Hearing, tympanic membrane, external ear and ear canal normal.  Left Ear: Hearing, tympanic membrane, external ear and ear canal normal.  Eyes: Conjunctivae, EOM and lids are normal. Pupils are equal, round, and reactive to light. No scleral icterus.  Neck: Trachea normal, normal range of motion and full passive range of motion without pain. Neck supple. No JVD present. No spinous process tenderness and no muscular tenderness present. Carotid bruit is not present. No mass and no thyromegaly present.  Cardiovascular: Normal rate, regular rhythm, S1 normal, S2 normal, normal heart sounds and normal pulses.   No extrasystoles are present. PMI is not displaced.  Exam reveals no gallop and no friction rub.   No murmur heard. Pulmonary/Chest: Effort normal and breath sounds normal. No respiratory distress. He has no decreased breath sounds. He has no wheezes.  Abdominal: Soft. Normal appearance and normal aorta. He exhibits no distension, no pulsatile midline mass and no mass. There is no hepatosplenomegaly. There is no tenderness. There is no guarding and no CVA tenderness. A hernia is present.  R flank/ hypogastric area hernia.  Genitourinary:  Deferred.  Musculoskeletal:       Cervical back: Normal.       Thoracic back: Normal.       Lumbar back: Normal.  Remainder of exam remarkable for degenerative changes in major joints and digits.  Lymphadenopathy:       Head (right side): No submental, no submandibular, no tonsillar, no posterior auricular and no occipital adenopathy present.       Head (left side): No submental, no submandibular, no tonsillar, no posterior auricular and no occipital adenopathy present.    He has no cervical adenopathy.       Right: No inguinal and no supraclavicular  adenopathy present.       Left: No inguinal and no supraclavicular adenopathy present.  Neurological: He is alert and oriented to person, place, and time. He has normal strength. He displays no atrophy and no tremor. No cranial nerve deficit or sensory deficit. He exhibits normal muscle tone. He displays a negative Romberg sign. Coordination and gait normal. He displays no Babinski's sign on the left side.  Reflex Scores:      Tricep reflexes are 1+ on the right side and 1+ on the left side.      Bicep reflexes are 2+ on the right side and 2+ on the left side.      Brachioradialis reflexes are 1+ on the right side and 1+ on the left side.      Patellar reflexes are 2+ on the right side and 2+ on the left side. Skin: Skin is warm, dry and intact. No ecchymosis, no lesion and no rash noted. He is not diaphoretic. No cyanosis  or erythema. No pallor.  Psychiatric: He has a normal mood and affect. His behavior is normal. Judgment and thought content normal. His speech is delayed. His speech is not slurred. He is communicative.  Very mild cognitive impairment.    [facility] reading (PRIMARY) by  [name]:  Heart size is upper limits of normal. S/P CABG. Lungs are clear w/o acute findings. No acute infiltrate or lesions.     Assessment & Plan:  Routine general medical examination at a health care facility - Plan: COMPLETE METABOLIC PANEL WITH GFR, Lipid panel, Thyroid Panel With TSH, PSA, Medicare  DYSLIPIDEMIA - Plan: Lipid panel  HYPOTHYROIDISM - Continue current medication pending lab results. Plan: Thyroid Panel With TSH  History of renal cell carcinoma - Plan: DG Chest 2 View, PSA, Medicare  HYPERTENSION - Stable on current medications; pt reports he occasionally takes Metoprolol after exercise because it "acts like a downer" and helps him relax. His dose had been reduced to once a day per Dr. Einar Gip. Plan: DG Chest 2 View, COMPLETE METABOLIC PANEL WITH GFR  Primary osteoarthritis of both  knees- RX: Tramadol prn.  Arthritis pain - Plan: traMADol (ULTRAM) 50 MG tablet  Meds ordered this encounter  Medications  . meloxicam (MOBIC) 15 MG tablet    Sig: Take 15 mg by mouth daily.  . traMADol (ULTRAM) 50 MG tablet    Sig: Take 1 tablet (50 mg total) by mouth every 12 (twelve) hours as needed.    Dispense:  60 tablet    Refill:  2

## 2013-12-06 NOTE — Patient Instructions (Signed)
Keeping you healthy  Get these tests  Blood pressure- Have your blood pressure checked once a year by your healthcare provider.  Normal blood pressure is 120/80  Weight- Have your body mass index (BMI) calculated to screen for obesity.  BMI is a measure of body fat based on height and weight. You can also calculate your own BMI at ViewBank[employer].[email or web address].  Cholesterol- Have your cholesterol checked every year.  Diabetes- Have your blood sugar checked regularly if you have high blood pressure, high cholesterol, have a family history of diabetes or if you are overweight.  Screening for Colon Cancer- Colonoscopy starting at age 34.  Screening may begin sooner depending on your family history and other health conditions. Follow up colonoscopy as directed by your Gastroenterologist.  Screening for Prostate Cancer- Both blood work (PSA) and a rectal exam help screen for Prostate Cancer.  Screening begins at age 53 with African-American men and at age 72 with Caucasian men.  Screening may begin sooner depending on your family history.  Take these medicines  Aspirin- One aspirin daily can help prevent Heart disease and Stroke.  Flu shot- Every fall.  Tetanus- Every 10 years. Tdap was given July 2008.  Zostavax- Once after the age of 56 to prevent Shingles. You reported you had this vaccine last year at a local pharmacy.  Pneumonia shot- Once after the age of 7; if you are younger than 10, ask your healthcare provider if you need a Pneumonia shot.  Take these steps  Don't smoke- If you do smoke, talk to your doctor about quitting.  For tips on how to quit, go to www.smokefree.gov or call 1-800-QUIT-NOW.  Be physically active- Exercise 5 days a week for at least 30 minutes.  If you are not already physically active start slow and gradually work up to 30 minutes of moderate physical activity.  Examples of moderate activity include walking briskly, mowing the yard, dancing, swimming, bicycling,  etc.  Eat a healthy diet- Eat a variety of healthy food such as fruits, vegetables, low fat milk, low fat cheese, yogurt, lean meant, poultry, fish, beans, tofu, etc. For more information go to www.thenutritionsource.org  Drink alcohol in moderation- Limit alcohol intake to less than two drinks a day. Never drink and drive.  Dentist- Brush and floss twice daily; visit your dentist twice a year.  Depression- Your emotional health is as important as your physical health. If you're feeling down, or losing interest in things you would normally enjoy please talk to your healthcare provider.  Eye exam- Visit your eye doctor every year.  Safe sex- If you may be exposed to a sexually transmitted infection, use a condom.  Seat belts- Seat belts can save your life; always wear one.  Smoke/Carbon Monoxide detectors- These detectors need to be installed on the appropriate level of your home.  Replace batteries at least once a year.  Skin cancer- When out in the sun, cover up and use sunscreen 15 SPF or higher.  Violence- If anyone is threatening you, please tell your healthcare provider.  Living Will/ Health care power of attorney- Speak with your healthcare provider and family.

## 2013-12-07 LAB — PSA, MEDICARE: PSA: 1.83 ng/mL (ref <=4.00)

## 2013-12-08 NOTE — Progress Notes (Signed)
Quick Note:  Please advise pt regarding following labs... All labs look good except cholesterol numbers. Triglycerides have increased and HDL ("good") cholesterol is still below 39.  Get an over-the-counter Fish Oil; supplement like Schiff MegaRed Omega-3 Krill Oil and take 1 capsule daily. Try to take this capsule by itself. This may help raise HDL cholesterol and lower triglycerides.  Thyroid results are normal; you are taking the right dose of thyroid medication.   Continue all other medications.  Copy to pt. ______

## 2013-12-10 ENCOUNTER — Telehealth: Payer: Self-pay | Admitting: Family Medicine

## 2013-12-10 NOTE — Telephone Encounter (Signed)
Message copied by Chinita Pester on Mon Dec 10, 2013  3:14 PM ------      Message from: [place], [place]      Created: Sun Dec 09, 2013 10:29 AM                   ----- Message -----         From: Barton Fanny, MD         Sent: 12/08/2013   8:29 PM           To: [facility] Lab Pool            Please advise pt regarding following labs...      All labs look good except cholesterol numbers. Triglycerides have increased and HDL ("good") cholesterol is still below 39.        Get an over-the-counter Fish Oil; supplement like Schiff MegaRed Omega-3 Krill Oil and take 1 capsule daily. Try to take this capsule by itself. This may help raise HDL cholesterol and lower triglycerides.            Thyroid results are normal; you are taking the right dose of thyroid medication.              Continue all other medications.            Copy to pt. ------

## 2013-12-10 NOTE — Telephone Encounter (Signed)
LMOM to CB. 

## 2013-12-26 NOTE — Telephone Encounter (Signed)
LMOM to CB. 

## 2013-12-31 ENCOUNTER — Ambulatory Visit (INDEPENDENT_AMBULATORY_CARE_PROVIDER_SITE_OTHER): Payer: Medicare Other | Admitting: Gastroenterology

## 2013-12-31 ENCOUNTER — Encounter: Payer: Self-pay | Admitting: Gastroenterology

## 2013-12-31 ENCOUNTER — Other Ambulatory Visit: Payer: Self-pay | Admitting: Gastroenterology

## 2013-12-31 VITALS — BP 146/74 | HR 64 | Ht 71.0 in | Wt 249.4 lb

## 2013-12-31 DIAGNOSIS — K219 Gastro-esophageal reflux disease without esophagitis: Secondary | ICD-10-CM | POA: Insufficient documentation

## 2013-12-31 DIAGNOSIS — R198 Other specified symptoms and signs involving the digestive system and abdomen: Secondary | ICD-10-CM

## 2013-12-31 DIAGNOSIS — R1310 Dysphagia, unspecified: Secondary | ICD-10-CM | POA: Insufficient documentation

## 2013-12-31 DIAGNOSIS — R194 Change in bowel habit: Secondary | ICD-10-CM | POA: Insufficient documentation

## 2013-12-31 DIAGNOSIS — D126 Benign neoplasm of colon, unspecified: Secondary | ICD-10-CM

## 2013-12-31 NOTE — Assessment & Plan Note (Addendum)
The patient has had reflux for years.  Symptoms seem to be related to weight.  With long-standing reflux Barrett's esophagus should be ruled out.  Recommendations #1 upper endoscopy #2 continue omeprazole

## 2013-12-31 NOTE — Assessment & Plan Note (Signed)
Intermittent dysphagia is probably due to 2 an early esophageal stricture.    Recommendations #1 upper endoscopy with dilation as indicated

## 2013-12-31 NOTE — Assessment & Plan Note (Signed)
With change in bowel habits a structural abnormality of the colon should be ruled out.  Recommendations #1 colonoscopy

## 2013-12-31 NOTE — Addendum Note (Signed)
Addended by: Oda Kilts on: 12/31/2013 12:08 PM   Modules accepted: Orders

## 2013-12-31 NOTE — Patient Instructions (Addendum)
You have been scheduled for a colonoscopy. Please follow written instructions given to you at your visit today.  Please pick up your prep kit at the pharmacy within the next 1-3 days. If you use inhalers (even only as needed), please bring them with you on the day of your procedure. Your physician has requested that you go to www.startemmi.com and enter the access code given to you at your visit today. This web site gives a general overview about your procedure. However, you should still follow specific instructions given to you by our office regarding your preparation for the procedure.  You have been scheduled for an endoscopy. Please follow written instructions given to you at your visit today. If you use inhalers (even only as needed), please bring them with you on the day of your procedure. Your physician has requested that you go to www.startemmi.com and enter the access code given to you at your visit today. This web site gives a general overview about your procedure. However, you should still follow specific instructions given to you by our office regarding your preparation for the procedure.  SUPREP SAMPLE GIVEN

## 2013-12-31 NOTE — Progress Notes (Signed)
_                                                                                                                History of Present Illness: Andre Cook of coronary artery disease, hypothyroidism post ablative therapy, hypertension, referred for evaluation of rectal bleeding and change in bowel habits.  Over the past 2 months he's developed constipation.  On one occasion, with straining, he saw bright red blood on the toilet tissue.  He denies abdominal pain, per se.  He now requires a stool softener to move his bowels.  Up until recently bowels moved regularly.  There's been no change in medications or diet.  Colonoscopy in 2007, by report, was normal.  Patient also complains of pyrosis when his weight exceeds 244 pounds.  He has intermittent dysphagia to solids.  Pyrosis is well-controlled with omeprazole.   Past Medical History  Diagnosis Date  . 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 Hypertension   . CAD (coronary artery disease) 2002    Post- op MI, CABG due to severe 3 vessel disease.  See cardiac cath report 02/02/2001  . Nephrolithiasis   . Dyslipidemia   . GERD (gastroesophageal reflux disease)   . Hyperglycemia   . Obesity   . MI (myocardial infarction) 2002    post- op 2002, severe 3 vessel disease, CABG.  Inferior wall MI  . 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 [name]  . Anxiety   . Heart disease   . Hypercholesterolemia   . Hypertrophy of prostate with urinary obstruction and other lower urinary tract symptoms (LUTS)    Past Surgical History  Procedure Laterality Date  . Appendectomy    . Coronary artery bypass graft  2007    4 vessels treated  . Tonsillectomy and adenoidectomy    . Finger amputation      right 4th finger: industrial accident  . 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 Renal biopsy    . Cardiac surgery    . Thyroidectomy     family history includes COPD in his mother;  Cancer in his sister; Heart disease in his father. Current Outpatient Prescriptions  Medication Sig Dispense Refill  . acetaminophen (TYLENOL) 325 MG tablet Take 325 mg by mouth every 6 (six) hours as needed.      Marland Kitchen atorvastatin (LIPITOR) 20 MG tablet Take 20 mg by mouth daily.      . cholecalciferol (VITAMIN D) 1000 UNITS tablet Take 1,000 Units by mouth daily.      Marland Kitchen desloratadine (CLARINEX) 5 MG tablet TAKE 1 TABLET DAILY  30 tablet  6  . diphenhydrAMINE (BENADRYL) 25 mg capsule Take 25 mg by mouth every 6 (six) hours as needed.      . meloxicam (MOBIC) 15 MG tablet Take 15 mg by mouth daily.      . metoprolol (LOPRESSOR) 50 MG tablet Take 1 tablet (50 mg total) by  mouth 2 (two) times daily.  180 tablet  3  . omeprazole (PRILOSEC) 20 MG capsule Take 20 mg by mouth daily.      . polyethylene glycol powder (GLYCOLAX/MIRALAX) powder Take 17 g by mouth once. Mix in 8 ounces of any liquid.  3350 g  1  . ramipril (ALTACE) 10 MG capsule TAKE 1 CAPSULE TWICE DAILY  180 capsule  1  . SYNTHROID 112 MCG tablet TAKE 1 TABLET DAILY  90 tablet  0  . tamsulosin (FLOMAX) 0.4 MG CAPS capsule TAKE 1 CAPSULE DAILY  90 capsule  0  . traMADol (ULTRAM) 50 MG tablet Take 1 tablet (50 mg total) by mouth every 12 (twelve) hours as needed.  60 tablet  2  . triamterene-hydrochlorothiazide (MAXZIDE-25) 37.5-25 MG per tablet Take 1 tablet by mouth daily.       No current facility-administered medications for this visit.   Allergies as of 12/31/2013 - Review Complete 12/31/2013  Allergen Reaction Noted  . Ciprofloxacin Rash 07/02/2011    reports that he quit smoking about 3 years ago. His smoking use included Cigarettes. He smoked 0.00 packs per day. He has never used smokeless tobacco. He reports that he does not drink alcohol or use illicit drugs.     Review of Systems: Pertinent positive and negative review of systems were noted in the above HPI section. All other review of systems were otherwise  negative.  Vital signs were reviewed in today's medical record Physical Exam: General: Well developed , well nourished, no acute distress Skin: anicteric Head: Normocephalic and atraumatic Eyes:  sclerae anicteric, EOMI Ears: Normal auditory acuity Mouth: No deformity or lesions Neck: Supple, no masses or thyromegaly Lungs: Clear throughout to auscultation Heart: Regular rate and rhythm; no murmurs, rubs or bruits Abdomen: Soft, non tender and non distended. No masses, hepatosplenomegaly or hernias noted. Normal Bowel sounds Rectal:deferred Musculoskeletal: Symmetrical with no gross deformities  Skin: No lesions on visible extremities Pulses:  Normal pulses noted Extremities: No clubbing, cyanosis, edema or deformities noted Neurological: Alert oriented x 4, grossly nonfocal Cervical Nodes:  No significant cervical adenopathy Inguinal Nodes: No significant inguinal adenopathy Psychological:  Alert and cooperative. Normal mood and affect  See Assessment and Plan under Problem List

## 2014-01-01 ENCOUNTER — Ambulatory Visit (AMBULATORY_SURGERY_CENTER): Payer: Medicare Other | Admitting: Gastroenterology

## 2014-01-01 ENCOUNTER — Encounter: Payer: Self-pay | Admitting: Gastroenterology

## 2014-01-01 VITALS — BP 128/74 | HR 54 | Temp 97.2°F | Resp 14 | Ht 71.0 in | Wt 249.0 lb

## 2014-01-01 DIAGNOSIS — R198 Other specified symptoms and signs involving the digestive system and abdomen: Secondary | ICD-10-CM

## 2014-01-01 DIAGNOSIS — D126 Benign neoplasm of colon, unspecified: Secondary | ICD-10-CM

## 2014-01-01 DIAGNOSIS — K625 Hemorrhage of anus and rectum: Secondary | ICD-10-CM

## 2014-01-01 MED ORDER — SODIUM CHLORIDE 0.9 % IV SOLN: 500.0000 mL | INTRAVENOUS | Status: DC

## 2014-01-01 NOTE — Patient Instructions (Addendum)
Take fiber supplements daily MiraLax one dose (17 g) every 3 days as necessary  YOU HAD AN ENDOSCOPIC PROCEDURE TODAY AT [facility]: Refer to the procedure report that was given to you for any specific questions about what was found during the examination.  If the procedure report does not answer your questions, please call your gastroenterologist to clarify.  If you requested that your care partner not be given the details of your procedure findings, then the procedure report has been included in a sealed envelope for you to review at your convenience later.  YOU SHOULD EXPECT: Some feelings of bloating in the abdomen. Passage of more gas than usual.  Walking can help get rid of the air that was put into your GI tract during the procedure and reduce the bloating. If you had a lower endoscopy (such as a colonoscopy or flexible sigmoidoscopy) you may notice spotting of blood in your stool or on the toilet paper. If you underwent a bowel prep for your procedure, then you may not have a normal bowel movement for a few days.  DIET: Your first meal following the procedure should be a light meal and then it is ok to progress to your normal diet.  A half-sandwich or bowl of soup is an example of a good first meal.  Heavy or fried foods are harder to digest and may make you feel nauseous or bloated.  Likewise meals heavy in dairy and vegetables can cause extra gas to form and this can also increase the bloating.  Drink plenty of fluids but you should avoid alcoholic beverages for 24 hours.  ACTIVITY: Your care partner should take you home directly after the procedure.  You should plan to take it easy, moving slowly for the rest of the day.  You can resume normal activity the day after the procedure however you should NOT DRIVE or use heavy machinery for 24 hours (because of the sedation medicines used during the test).    SYMPTOMS TO REPORT IMMEDIATELY: A gastroenterologist can be reached at  any hour.  During normal business hours, 8:30 AM to 5:00 PM Monday through Friday, call [phone].  After hours and on weekends, please call the GI answering service at [phone] who will take a message and have the physician on call contact you.   Following lower endoscopy (colonoscopy or flexible sigmoidoscopy):  Excessive amounts of blood in the stool  Significant tenderness or worsening of abdominal pains  Swelling of the abdomen that is new, acute  Fever of 100F or higher  FOLLOW UP: If any biopsies were taken you will be contacted by phone or by letter within the next 1-3 weeks.  Call your gastroenterologist if you have not heard about the biopsies in 3 weeks.  Our staff will call the home number listed on your records the next business day following your procedure to check on you and address any questions or concerns that you may have at that time regarding the information given to you following your procedure. This is a courtesy call and so if there is no answer at the home number and we have not heard from you through the emergency physician on call, we will assume that you have returned to your regular daily activities without incident.  SIGNATURES/CONFIDENTIALITY: You and/or your care partner have signed paperwork which will be entered into your electronic medical record.  These signatures attest to the fact that that the information above on your After Visit  Summary has been reviewed and is understood.  Full responsibility of the confidentiality of this discharge information lies with you and/or your care-partner. 

## 2014-01-01 NOTE — Op Note (Signed)
[place]  [address] [place], [id number]   COLONOSCOPY PROCEDURE REPORT  PATIENT: Andre Cook, Andre Cook  MR#: [id number] BIRTHDATE: Aug 02, 1945 , 68  yrs. old GENDER: Male ENDOSCOPIST: Inda Castle, MD REFERRED GX:QJJHERD McPherson, M.D. PROCEDURE DATE:  01/01/2014 PROCEDURE:   Colonoscopy with cold biopsy polypectomy First Screening Colonoscopy - Avg.  risk and is 50 yrs.  old or older - No.  Prior Negative Screening - Now for repeat screening. Other: See Comments  History of Adenoma - Now for follow-up colonoscopy & has been > or = to 3 yrs.  N/A  Polyps Removed Today? Yes. ASA CLASS:   Class II INDICATIONS:Change in bowel habits and Rectal Bleeding. MEDICATIONS: Propofol (Diprivan) 200 mg IV  DESCRIPTION OF PROCEDURE:   After the risks benefits and alternatives of the procedure were thoroughly explained, informed consent was obtained.  A digital rectal exam revealed no abnormalities of the rectum.   The LB [id number]  endoscope was introduced through the anus and advanced to the cecum, which was identified by both the appendix and ileocecal valve. No adverse events experienced.   The quality of the prep was excellent using Suprep  The instrument was then slowly withdrawn as the colon was fully examined.      COLON FINDINGS: A flat polyp measuring 2 mm in size was found in the ascending colon.  A polypectomy was performed with cold forceps. The colon was otherwise normal.  There was no diverticulosis, inflammation, polyps or cancers unless previously stated. Retroflexed views revealed no abnormalities. The time to cecum=4 minutes 10 seconds.  Withdrawal time=9 minutes 30 seconds.  The scope was withdrawn and the procedure completed. COMPLICATIONS: There were no complications.  ENDOSCOPIC IMPRESSION: 1.   Flat polyp measuring 3 mm in size was found in the ascending colon; polypectomy was performed with cold forceps 2.   The colon was  otherwise normal  RECOMMENDATIONS: 1.  If the polyp(s) removed today are proven to be adenomatous (pre-cancerous) polyps, you will need a repeat colonoscopy in 5 years.  Otherwise you should continue to follow colorectal cancer screening guidelines for "routine risk" patients with colonoscopy in 10 years.  You will receive a letter within 1-2 weeks with the results of your biopsy as well as final recommendations.  Please call my office if you have not received a letter after 3 weeks. 2.  fiber supplementation daily 3.  MiraLax 17 g every 3 days if no bowel movement 4.  office visit 8-10 weeks   eSigned:  Inda Castle, MD 01/01/2014 11:35 AM   cc:

## 2014-01-01 NOTE — Progress Notes (Signed)
Called to room to assist during endoscopic procedure.  Patient ID and intended procedure confirmed with present staff. Received instructions for my participation in the procedure from the performing physician.  

## 2014-01-01 NOTE — Progress Notes (Signed)
A/ox3, pleased with MAC, report to RN 

## 2014-01-02 ENCOUNTER — Telehealth: Payer: Self-pay

## 2014-01-02 NOTE — Telephone Encounter (Signed)
Left message on answering machine. 

## 2014-01-07 ENCOUNTER — Encounter: Payer: Self-pay | Admitting: Gastroenterology

## 2014-02-08 ENCOUNTER — Other Ambulatory Visit: Payer: Self-pay | Admitting: Family Medicine

## 2014-02-14 ENCOUNTER — Other Ambulatory Visit: Payer: Self-pay | Admitting: Family Medicine

## 2014-03-04 ENCOUNTER — Encounter: Payer: Self-pay | Admitting: *Deleted

## 2014-03-04 ENCOUNTER — Encounter: Payer: Medicare Other | Admitting: Gastroenterology

## 2014-03-07 ENCOUNTER — Ambulatory Visit (AMBULATORY_SURGERY_CENTER): Payer: Medicare Other | Admitting: Gastroenterology

## 2014-03-07 ENCOUNTER — Encounter: Payer: Self-pay | Admitting: *Deleted

## 2014-03-07 ENCOUNTER — Telehealth: Payer: Self-pay | Admitting: *Deleted

## 2014-03-07 ENCOUNTER — Encounter: Payer: Self-pay | Admitting: Gastroenterology

## 2014-03-07 VITALS — BP 110/61 | HR 82 | Temp 97.9°F | Resp 24 | Ht 71.0 in | Wt 249.0 lb

## 2014-03-07 DIAGNOSIS — R1310 Dysphagia, unspecified: Secondary | ICD-10-CM

## 2014-03-07 DIAGNOSIS — K259 Gastric ulcer, unspecified as acute or chronic, without hemorrhage or perforation: Secondary | ICD-10-CM

## 2014-03-07 DIAGNOSIS — K222 Esophageal obstruction: Secondary | ICD-10-CM

## 2014-03-07 DIAGNOSIS — K257 Chronic gastric ulcer without hemorrhage or perforation: Secondary | ICD-10-CM

## 2014-03-07 DIAGNOSIS — K319 Disease of stomach and duodenum, unspecified: Secondary | ICD-10-CM

## 2014-03-07 MED ORDER — SODIUM CHLORIDE 0.9 % IV SOLN: 500.0000 mL | INTRAVENOUS | Status: DC

## 2014-03-07 NOTE — Progress Notes (Signed)
Called to room to assist during endoscopic procedure.  Patient ID and intended procedure confirmed with present staff. Received instructions for my participation in the procedure from the performing physician.  

## 2014-03-07 NOTE — Progress Notes (Signed)
Report to PACU, RN, vss, BBS= Clear.  

## 2014-03-07 NOTE — Patient Instructions (Signed)
Ok I cancelled  ===View-only below this line===  ----- Message -----    From: Inda Castle, MD    Sent: 03/07/2014   3:07 PM      To: Oda Kilts, CMA  I am seeing the patient today at the [facility].  You can cancel tomorrow morning's appointment.

## 2014-03-07 NOTE — Op Note (Signed)
[place]  [address]. [place], [id number]   ENDOSCOPY PROCEDURE REPORT  PATIENT: Andre Cook, Andre Cook  MR#: [id number] BIRTHDATE: 1946/06/03 , 28  yrs. old GENDER: male ENDOSCOPIST: Inda Castle, MD REFERRED BY:  Ellsworth Lennox, M.D. PROCEDURE DATE:  03/07/2014 PROCEDURE:  EGD w/ biopsy and Maloney dilation of esophagus ASA CLASS:     Class II INDICATIONS:  dysphagia. MEDICATIONS: Monitored anesthesia care and Propofol 150 mg IV TOPICAL ANESTHETIC:  DESCRIPTION OF PROCEDURE: After the risks benefits and alternatives of the procedure were thoroughly explained, informed consent was obtained.  The LB [id number] endoscope was introduced through the mouth and advanced to the third portion of the duodenum , Without limitations.  The instrument was slowly withdrawn as the mucosa was fully examined.      STOMACH: Three non-bleeding ulcers measuring 2 x 74mm in size were found in the prepyloric region of the stomach.  Biopsies were taken at edge of the ulcers.  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  The stricture was traversable.  The stricture was dilated using a 85mm (54Fr) Maloney dilator.  ESOPHAGUS: Except for the findings listed the EGD was otherwise normal.  Retroflexed views revealed no abnormalities.     The scope was then withdrawn from the patient and the procedure completed.  COMPLICATIONS: There were no complications.  ENDOSCOPIC IMPRESSION: 1.   Three ulcers measuring 2 x 54mm in size were found in the prepyloric region of the stomach; biopsies were taken 2.   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   EGD was otherwise normal  Ulcers are probably related to NSAID use  RECOMMENDATIONS: Await biopsy results continue omeprazole Would NSAIDs for 2 weeks  REPEAT EXAM:  eSigned:  Inda Castle, MD  03/07/2014 3:29 PM    CC:  PATIENT NAME:  [name], [name] MR#: [id number]

## 2014-03-07 NOTE — Patient Instructions (Signed)

## 2014-03-07 NOTE — Telephone Encounter (Signed)
Ok I cancelled  ===View-only below this line===  ----- Message -----    From: Inda Castle, MD    Sent: 03/07/2014   3:07 PM      To: Oda Kilts, CMA  I am seeing the patient today at the [facility].  You can cancel tomorrow morning's appointment.

## 2014-03-08 ENCOUNTER — Ambulatory Visit: Payer: Medicare Other | Admitting: Gastroenterology

## 2014-03-08 ENCOUNTER — Telehealth: Payer: Self-pay | Admitting: *Deleted

## 2014-03-08 NOTE — Telephone Encounter (Signed)
Left message that we called for f/u 

## 2014-03-12 ENCOUNTER — Ambulatory Visit (INDEPENDENT_AMBULATORY_CARE_PROVIDER_SITE_OTHER): Payer: Medicare Other | Admitting: Family Medicine

## 2014-03-12 VITALS — BP 116/60 | HR 66 | Temp 97.5°F | Resp 16 | Ht 72.5 in | Wt 238.6 lb

## 2014-03-12 DIAGNOSIS — M1990 Unspecified osteoarthritis, unspecified site: Secondary | ICD-10-CM

## 2014-03-12 DIAGNOSIS — M129 Arthropathy, unspecified: Secondary | ICD-10-CM

## 2014-03-12 DIAGNOSIS — M25569 Pain in unspecified knee: Secondary | ICD-10-CM

## 2014-03-12 DIAGNOSIS — G8929 Other chronic pain: Secondary | ICD-10-CM

## 2014-03-12 DIAGNOSIS — M25562 Pain in left knee: Principal | ICD-10-CM

## 2014-03-12 DIAGNOSIS — M25561 Pain in right knee: Secondary | ICD-10-CM

## 2014-03-12 MED ORDER — METHYLPREDNISOLONE ACETATE 80 MG/ML IJ SUSP: 80.0000 mg | Freq: Once | INTRAMUSCULAR | Status: DC

## 2014-03-12 MED ORDER — METHYLPREDNISOLONE ACETATE 80 MG/ML IJ SUSP
80.0000 mg | Freq: Once | INTRAMUSCULAR | Status: AC
  Administered 2014-03-12: 80 mg via INTRA_ARTICULAR

## 2014-03-12 MED ORDER — TRAMADOL HCL 50 MG PO TABS: 50.0000 mg | ORAL_TABLET | Freq: Three times a day (TID) | ORAL | Status: DC | PRN

## 2014-03-12 NOTE — Patient Instructions (Signed)
Returned for shot and left knee if the shot works for the right knee

## 2014-03-12 NOTE — Progress Notes (Signed)
Subjective:  This chart was scribed for Andre Ray, MD by Marti Sleigh, Medical Scribe. This patient was seen in Room 10 and the patient's care was started at 3:04 PM.     Patient ID: Andre Cook., male    DOB: May 28, 1946, 68 y.o.   MRN: [id number]  HPI HPI Comments: Andre Cook. is a 68 y.o. male with a history of arthritis in his knees bilaterally, who presents to [facility] needing a medication refill. Pt states that he takes 3-4 tramadol per day, and that the prescription his pharmacist gives does not last until his refill date. Pt states that he has a provider for his arthritis but doesn't recall the name, and his PCP is [name]. Pt has had past x-rays that showed some deterioration in the knees. Pt received a cortisol shot in his knee 20 years ago with relief. 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  Patient Active Problem List   Diagnosis Date Noted   Change in bowel habits 12/31/2013   Dysphagia, unspecified(787.20) 12/31/2013   Esophageal reflux 12/31/2013   Hypertrophy of prostate without urinary obstruction and other lower urinary tract symptoms (LUTS) 11/26/2012   Cardiomyopathy, ischemic 11/10/2012   CAD (coronary artery disease) 07/02/2011   Obesity 07/02/2011   Hypothyroidism, postablative    CARCINOMA, RENAL CELL 10/31/2008   HYPOTHYROIDISM 10/31/2008   DYSLIPIDEMIA 10/31/2008   HYPERTENSION 10/31/2008   MYOCARDIAL INFARCTION 10/31/2008   Past Medical History  Diagnosis Date   Hypothyroidism, postablative     had ablation due to thyrotoxicosis   Hypertension    CAD (coronary artery disease) 2002    Post- op MI, CABG due to severe 3 vessel disease.  See cardiac cath report 02/02/2001   Nephrolithiasis    Dyslipidemia    GERD (gastroesophageal reflux disease)    Hyperglycemia    Obesity    MI (myocardial infarction) 2002    post- op 2002, severe 3 vessel disease, CABG.  Inferior wall MI     Basal cell carcinoma of eyelid 05/2008    Dr. [name]   Anxiety    Heart disease    Hypercholesterolemia    Hypertrophy of prostate with urinary obstruction and other lower urinary tract symptoms (LUTS)    Allergy    Arthritis    Past Surgical History  Procedure Laterality Date   Appendectomy     Coronary artery bypass graft  2007    4 vessels treated   Tonsillectomy and adenoidectomy     Finger amputation      right 4th finger: industrial accident   Partial nephrectomy  01/2001    Dr. Risa Grill, clear cell carcinoma   Renal biopsy     Cardiac surgery     Thyroidectomy     Allergies  Allergen Reactions   Ciprofloxacin Rash   Prior to Admission medications   Medication Sig Start Date End Date Taking? Authorizing Provider  acetaminophen (TYLENOL) 325 MG tablet Take 325 mg by mouth every 6 (six) hours as needed.   Yes Historical Provider, MD  atorvastatin (LIPITOR) 20 MG tablet Take 20 mg by mouth daily.   Yes Historical Provider, MD  cholecalciferol (VITAMIN D) 1000 UNITS tablet Take 1,000 Units by mouth daily.   Yes Historical Provider, MD  desloratadine (CLARINEX) 5 MG tablet TAKE 1 TABLET DAILY   Yes Barton Fanny, MD  metoprolol (LOPRESSOR) 50 MG tablet Take 1 tablet (50 mg total) by mouth 2 (  two) times daily. 08/17/12  Yes Barton Fanny, MD  omeprazole (PRILOSEC) 20 MG capsule Take 20 mg by mouth daily.   Yes Historical Provider, MD  polyethylene glycol powder (GLYCOLAX/MIRALAX) powder Take 17 g by mouth once. Mix in 8 ounces of any liquid. 10/19/13  Yes Wardell Honour, MD  ramipril (ALTACE) 10 MG capsule TAKE 1 CAPSULE TWICE DAILY   Yes Barton Fanny, MD  SYNTHROID 112 MCG tablet TAKE 1 TABLET DAILY 02/12/14  Yes Barton Fanny, MD  tamsulosin (FLOMAX) 0.4 MG CAPS capsule TAKE 1 CAPSULE DAILY 02/15/14  Yes Barton Fanny, MD  traMADol (ULTRAM) 50 MG tablet Take 1 tablet (50 mg total) by mouth every 12 (twelve) hours as needed. 12/06/13  Yes  Barton Fanny, MD  triamterene-hydrochlorothiazide (MAXZIDE-25) 37.5-25 MG per tablet Take 1 tablet by mouth daily.   Yes Historical Provider, MD  Aspirin-Salicylamide-Caffeine (BC HEADACHE POWDER PO) Take by mouth.    Historical Provider, MD  diphenhydrAMINE (BENADRYL) 25 mg capsule Take 25 mg by mouth every 6 (six) hours as needed.    Historical Provider, MD  meloxicam (MOBIC) 15 MG tablet Take 15 mg by mouth daily.    Historical Provider, MD  methylPREDNISolone acetate (DEPO-MEDROL) 80 MG/ML injection Inject 1 mL (80 mg total) into the articular space once. 03/12/14   Robyn Haber, MD   History   Social History   Marital Status: Legally Separated    Spouse Name: N/A    Number of Children: N/A   Years of Education: N/A   Occupational History   Retired    Social History Main Topics   Smoking status: Former Smoker    Types: Cigarettes    Quit date: 10/31/2005   Smokeless tobacco: Never Used   Alcohol Use: No   Drug Use: No   Sexual Activity: No   Other Topics Concern   Not on file   Social History Narrative   No narrative on file    Review of Systems  Musculoskeletal: Positive for arthralgias and gait problem.       Objective:   Physical Exam  Nursing note and vitals reviewed. Constitutional: He is oriented to person, place, and time. He appears well-developed and well-nourished. No distress.  HENT:  Head: Normocephalic and atraumatic.  Mouth/Throat: Oropharynx is clear and moist. No oropharyngeal exudate.  Eyes: Conjunctivae and EOM are normal. Pupils are equal, round, and reactive to light.  Neck: Normal range of motion. Neck supple.  No meningismus.  Cardiovascular: Normal rate, regular rhythm, normal heart sounds and intact distal pulses.   No murmur heard. Pulmonary/Chest: Effort normal and breath sounds normal. No respiratory distress.  Abdominal: Soft. There is no tenderness. There is no rebound and no guarding.  Musculoskeletal: Normal  range of motion. He exhibits no edema and no tenderness.  Crepitus in both knees.  Full range of motion.  No significant effusion.    Neurological: He is alert and oriented to person, place, and time. No cranial nerve deficit. He exhibits normal muscle tone. Coordination normal.  No ataxia on finger to nose bilaterally. No pronator drift. 5/5 strength throughout. CN 2-12 intact. Negative Romberg. Equal grip strength. Sensation intact. Gait is normal.   Skin: Skin is warm.  Psychiatric: He has a normal mood and affect. His behavior is normal.   BP 116/60   Pulse 66   Temp(Src) 97.5 F (36.4 C) (Oral)   Resp 16   Ht 6' 0.5" (1.842 m)   Wt 238  lb 9.6 oz (108.228 kg)   BMI 31.90 kg/m2   SpO2 95%     Assessment & Plan:   I personally performed the services described in this documentation, which was scribed in my presence. The recorded information has been reviewed and is accurate.  Knee pain, bilateral - Plan: traMADol (ULTRAM) 50 MG tablet, methylPREDNISolone acetate (DEPO-MEDROL) injection 80 mg, DISCONTINUED: methylPREDNISolone acetate (DEPO-MEDROL) 80 MG/ML injection  Knee pain, chronic, right - Plan: traMADol (ULTRAM) 50 MG tablet, methylPREDNISolone acetate (DEPO-MEDROL) injection 80 mg, DISCONTINUED: methylPREDNISolone acetate (DEPO-MEDROL) 80 MG/ML injection  Arthritis pain - Plan: traMADol (ULTRAM) 50 MG tablet, methylPREDNISolone acetate (DEPO-MEDROL) injection 80 mg  Signed, Robyn Haber, MD

## 2014-03-13 ENCOUNTER — Encounter: Payer: Self-pay | Admitting: Family Medicine

## 2014-03-13 ENCOUNTER — Encounter: Payer: Self-pay | Admitting: Gastroenterology

## 2014-03-14 ENCOUNTER — Other Ambulatory Visit: Payer: Self-pay

## 2014-03-14 MED ORDER — RAMIPRIL 10 MG PO CAPS: ORAL_CAPSULE | ORAL | Status: DC

## 2014-03-27 ENCOUNTER — Ambulatory Visit (INDEPENDENT_AMBULATORY_CARE_PROVIDER_SITE_OTHER): Payer: Medicare Other | Admitting: Family Medicine

## 2014-03-27 VITALS — BP 143/61 | HR 61 | Temp 97.8°F | Resp 18 | Ht 72.5 in | Wt 233.8 lb

## 2014-03-27 DIAGNOSIS — N400 Enlarged prostate without lower urinary tract symptoms: Secondary | ICD-10-CM

## 2014-03-27 DIAGNOSIS — M1712 Unilateral primary osteoarthritis, left knee: Secondary | ICD-10-CM

## 2014-03-27 MED ORDER — TAMSULOSIN HCL 0.4 MG PO CAPS: 0.4000 mg | ORAL_CAPSULE | Freq: Every day | ORAL | Status: DC

## 2014-03-27 MED ORDER — IBUPROFEN 800 MG PO TABS: 800.0000 mg | ORAL_TABLET | Freq: Three times a day (TID) | ORAL | Status: DC | PRN

## 2014-03-27 MED ORDER — METHYLPREDNISOLONE ACETATE 80 MG/ML IJ SUSP
80.0000 mg | Freq: Once | INTRAMUSCULAR | Status: AC
  Administered 2014-03-27: 80 mg via INTRA_ARTICULAR

## 2014-03-27 NOTE — Progress Notes (Signed)
This chart was scribed for Robyn Haber, MD by Terressa Koyanagi, ED Scribe at [facility]. This patient was seen in room Room 4 and the patient's care was started at 5:43 PM.  Patient ID: Andre Cook. MRN: [id number], DOB: [birth year], 68 y.o. Date of Encounter: 03/27/2014, 5:43 PM  Primary Physician: Ellsworth Lennox, MD  Chief Complaint  Patient presents with  . Knee Pain    L knee, pt is requesting injection in knee   HPI: 68 y.o. year old male, with history below and significant for arthritis in both knees, presents with left knee pain.     Right Knee: Pt states that he has had no pain in his right knee after receiving the traMADol (ULTRAM) 50 MG tablet, methylPREDNISolone acetate (DEPO-MEDROL) injection 80 mg on 03/12/14.    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  Past Medical History  Diagnosis Date  . 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 Hypertension   . CAD (coronary artery disease) 2002    Post- op MI, CABG due to severe 3 vessel disease.  See cardiac cath report 02/02/2001  . Nephrolithiasis   . Dyslipidemia   . GERD (gastroesophageal reflux disease)   . Hyperglycemia   . Obesity   . MI (myocardial infarction) 2002    post- op 2002, severe 3 vessel disease, CABG.  Inferior wall MI  . 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 [name]  . Anxiety   . Heart disease   . Hypercholesterolemia   . Hypertrophy of prostate with urinary obstruction and other lower urinary tract symptoms (LUTS)   . Allergy   . Arthritis      Home Meds: Prior to Admission medications   Medication Sig Start Date End Date Taking? Authorizing Provider  acetaminophen (TYLENOL) 325 MG tablet Take 325 mg by mouth every 6 (six) hours as needed.   Yes Historical Provider, MD  atorvastatin (LIPITOR) 20 MG tablet Take 20 mg by mouth daily.   Yes Historical Provider, MD  cholecalciferol  (VITAMIN D) 1000 UNITS tablet Take 1,000 Units by mouth daily.   Yes Historical Provider, MD  desloratadine (CLARINEX) 5 MG tablet TAKE 1 TABLET DAILY   Yes Barton Fanny, MD  metoprolol (LOPRESSOR) 50 MG tablet Take 1 tablet (50 mg total) by mouth 2 (two) times daily. 08/17/12  Yes Barton Fanny, MD  omeprazole (PRILOSEC) 20 MG capsule Take 20 mg by mouth daily.   Yes Historical Provider, MD  polyethylene glycol powder (GLYCOLAX/MIRALAX) powder Take 17 g by mouth once. Mix in 8 ounces of any liquid. 10/19/13  Yes Wardell Honour, MD  ramipril (ALTACE) 10 MG capsule TAKE 1 CAPSULE TWICE DAILY 03/14/14  Yes Barton Fanny, MD  SYNTHROID 112 MCG tablet TAKE 1 TABLET DAILY 02/12/14  Yes Barton Fanny, MD  tamsulosin (FLOMAX) 0.4 MG CAPS capsule TAKE 1 CAPSULE DAILY 02/15/14  Yes Barton Fanny, MD  traMADol (ULTRAM) 50 MG tablet Take 1 tablet (50 mg total) by mouth every 8 (eight) hours as needed. 03/12/14  Yes Robyn Haber, MD  triamterene-hydrochlorothiazide (MAXZIDE-25) 37.5-25 MG per tablet Take 1 tablet by mouth daily.   Yes Historical Provider, MD  Aspirin-Salicylamide-Caffeine (BC HEADACHE POWDER PO) Take by mouth.    Historical Provider, MD  diphenhydrAMINE (BENADRYL) 25 mg capsule Take 25 mg by mouth every 6 (six) hours as needed.  Historical Provider, MD  meloxicam (MOBIC) 15 MG tablet Take 15 mg by mouth daily.    Historical Provider, MD    Allergies:  Allergies  Allergen Reactions  . Ciprofloxacin Rash    History   Social History  . Marital Status: Legally Separated    Spouse Name: N/A    Number of Children: N/A  . Years of Education: N/A   Occupational History  . Retired    Social History Main Topics  . Smoking status: Former Smoker    Types: Cigarettes    Quit date: 10/31/2005  . Smokeless tobacco: Never Used  . Alcohol Use: No  . Drug Use: No  . Sexual Activity: No   Other Topics Concern  . Not on file   Social History Narrative  . No  narrative on file     Review of Systems: Constitutional: negative for chills, fever, night sweats, weight changes, or fatigue  HEENT: negative for vision changes, hearing loss, congestion, rhinorrhea, ST, epistaxis, or sinus pressure Cardiovascular: negative for chest pain or palpitations Respiratory: negative for hemoptysis, wheezing, shortness of breath, or cough Abdominal: negative for abdominal pain, nausea, vomiting, diarrhea, or constipation Dermatological: negative for rash Neurologic: negative for headache, dizziness, or syncope All other systems reviewed and are otherwise negative with the exception to those above and in the HPI. Musc: Left knee pain   Physical Exam: Triage Vitals: Blood pressure 143/61, pulse 61, temperature 97.8 F (36.6 C), temperature source Oral, resp. rate 18, height 6' 0.5" (1.842 m), weight 233 lb 12.8 oz (106.051 kg), SpO2 96.00%., Body mass index is 31.26 kg/(m^2). General: Well developed, well nourished, in no acute distress. Head: Normocephalic, atraumatic, eyes without discharge, sclera non-icteric, nares are without discharge. Bilateral auditory canals clear, TM's are without perforation, pearly grey and translucent with reflective cone of light bilaterally. Oral cavity moist, posterior pharynx without exudate, erythema, peritonsillar abscess, or post nasal drip.  Neck: Supple. No thyromegaly. Full ROM. No lymphadenopathy. Lungs: Clear bilaterally to auscultation without wheezes, rales, or rhonchi. Breathing is unlabored. Heart: RRR with S1 S2. No murmurs, rubs, or gallops appreciated. Abdomen: Soft, non-tender, non-distended with normoactive bowel sounds. No hepatomegaly. No rebound/guarding. No obvious abdominal masses. Msk:  Strength and tone normal for age. Crepitus in both knees. Full range of motion. Small effusion on left knee.  Extremities/Skin: Warm and dry. No clubbing or cyanosis. No edema. No rashes or suspicious lesions. Neuro: Alert and  oriented X 3. Moves all extremities spontaneously. Gait is normal. CNII-XII grossly in tact. Psych:  Responds to questions appropriately with a normal affect.   After sterile prep, Depo-Medrol injected without complication.    ASSESSMENT AND PLAN:  DIAGNOSTIC STUDIES: Oxygen Saturation is 96% on RA, adequate by my interpretation.    COORDINATION OF CARE: 5:48 PM-Discussed treatment plan which includes traMADol (ULTRAM) 50 MG tablet, methylPREDNISolone acetate (DEPO-MEDROL) injection 80 mg to the left knee with pt at bedside and pt agreed to plan.   68 y.o. year old male with Primary osteoarthritis of left knee - Plan: methylPREDNISolone acetate (DEPO-MEDROL) injection 80 mg  BPH (benign prostatic hypertrophy) - Plan: tamsulosin (FLOMAX) 0.4 MG CAPS capsule, CANCELED: Ambulatory referral to Urology     Signed, Robyn Haber, MD 03/27/2014 5:43 PM  I personally performed the services described in this documentation, which was scribed in my presence. The recorded information has been reviewed and is accurate.

## 2014-04-20 ENCOUNTER — Ambulatory Visit (INDEPENDENT_AMBULATORY_CARE_PROVIDER_SITE_OTHER): Payer: Medicare Other

## 2014-04-20 ENCOUNTER — Ambulatory Visit: Payer: Medicare Other

## 2014-04-20 DIAGNOSIS — Z23 Encounter for immunization: Secondary | ICD-10-CM

## 2014-04-29 ENCOUNTER — Other Ambulatory Visit: Payer: Self-pay

## 2014-04-29 DIAGNOSIS — N400 Enlarged prostate without lower urinary tract symptoms: Secondary | ICD-10-CM

## 2014-04-29 MED ORDER — TAMSULOSIN HCL 0.4 MG PO CAPS: 0.4000 mg | ORAL_CAPSULE | Freq: Every day | ORAL | Status: DC

## 2014-05-24 ENCOUNTER — Other Ambulatory Visit: Payer: Self-pay | Admitting: Family Medicine

## 2014-05-31 ENCOUNTER — Other Ambulatory Visit (HOSPITAL_COMMUNITY): Payer: Self-pay | Admitting: *Deleted

## 2014-05-31 DIAGNOSIS — I6523 Occlusion and stenosis of bilateral carotid arteries: Secondary | ICD-10-CM

## 2014-06-03 ENCOUNTER — Ambulatory Visit (HOSPITAL_COMMUNITY): Payer: Medicare Other | Attending: Internal Medicine | Admitting: Cardiology

## 2014-06-03 DIAGNOSIS — Z87891 Personal history of nicotine dependence: Secondary | ICD-10-CM | POA: Diagnosis not present

## 2014-06-03 DIAGNOSIS — I10 Essential (primary) hypertension: Secondary | ICD-10-CM | POA: Insufficient documentation

## 2014-06-03 DIAGNOSIS — I2510 Atherosclerotic heart disease of native coronary artery without angina pectoris: Secondary | ICD-10-CM | POA: Diagnosis not present

## 2014-06-03 DIAGNOSIS — I6523 Occlusion and stenosis of bilateral carotid arteries: Secondary | ICD-10-CM | POA: Insufficient documentation

## 2014-06-03 DIAGNOSIS — E785 Hyperlipidemia, unspecified: Secondary | ICD-10-CM | POA: Insufficient documentation

## 2014-06-03 NOTE — Progress Notes (Signed)
Carotid duplex performed 

## 2014-06-05 ENCOUNTER — Telehealth: Payer: Self-pay | Admitting: Family Medicine

## 2014-06-05 NOTE — Telephone Encounter (Signed)
I phoned pt to advise him about results of carotid doppler study. Blood flow is good and there is minimal plaque in carotid arteries. Repeat study in 2 years is recommended.

## 2014-06-05 NOTE — Progress Notes (Signed)
Quick Note:  Your recent imaging study of the carotid arteries shows good blood flow with small amount of plaque in the blood vessels. Repeat study is recommended in 2 years.  Contact the clinic if you have any questions. ______

## 2014-06-08 ENCOUNTER — Ambulatory Visit (INDEPENDENT_AMBULATORY_CARE_PROVIDER_SITE_OTHER): Payer: Medicare Other | Admitting: Emergency Medicine

## 2014-06-08 VITALS — BP 120/78 | HR 70 | Temp 98.0°F | Resp 16 | Ht 73.0 in | Wt 231.6 lb

## 2014-06-08 DIAGNOSIS — M1990 Unspecified osteoarthritis, unspecified site: Secondary | ICD-10-CM

## 2014-06-08 DIAGNOSIS — I10 Essential (primary) hypertension: Secondary | ICD-10-CM

## 2014-06-08 DIAGNOSIS — M129 Arthropathy, unspecified: Secondary | ICD-10-CM

## 2014-06-08 DIAGNOSIS — M25561 Pain in right knee: Secondary | ICD-10-CM

## 2014-06-08 DIAGNOSIS — E785 Hyperlipidemia, unspecified: Secondary | ICD-10-CM

## 2014-06-08 DIAGNOSIS — I6523 Occlusion and stenosis of bilateral carotid arteries: Secondary | ICD-10-CM

## 2014-06-08 DIAGNOSIS — G8929 Other chronic pain: Secondary | ICD-10-CM

## 2014-06-08 DIAGNOSIS — E039 Hypothyroidism, unspecified: Secondary | ICD-10-CM

## 2014-06-08 DIAGNOSIS — M25562 Pain in left knee: Secondary | ICD-10-CM

## 2014-06-08 LAB — POCT CBC
Granulocyte percent: 71.6 %G (ref 37–80)
HCT, POC: 52.8 % (ref 43.5–53.7)
HEMOGLOBIN: 17.1 g/dL (ref 14.1–18.1)
Lymph, poc: 2.8 (ref 0.6–3.4)
MCH: 30.8 pg (ref 27–31.2)
MCHC: 32.4 g/dL (ref 31.8–35.4)
MCV: 95 fL (ref 80–97)
MID (cbc): 0.7 (ref 0–0.9)
MPV: 7.7 fL (ref 0–99.8)
POC Granulocyte: 8.9 — AB (ref 2–6.9)
POC LYMPH %: 22.8 % (ref 10–50)
POC MID %: 5.6 %M (ref 0–12)
Platelet Count, POC: 266 10*3/uL (ref 142–424)
RBC: 5.55 M/uL (ref 4.69–6.13)
RDW, POC: 13.6 %
WBC: 12.4 10*3/uL — AB (ref 4.6–10.2)

## 2014-06-08 LAB — TSH: TSH: 2.567 u[IU]/mL (ref 0.350–4.500)

## 2014-06-08 LAB — COMPLETE METABOLIC PANEL WITH GFR
ALBUMIN: 4.8 g/dL (ref 3.5–5.2)
ALT: 21 U/L (ref 0–53)
AST: 18 U/L (ref 0–37)
Alkaline Phosphatase: 63 U/L (ref 39–117)
BUN: 27 mg/dL — ABNORMAL HIGH (ref 6–23)
CALCIUM: 10.2 mg/dL (ref 8.4–10.5)
CHLORIDE: 101 meq/L (ref 96–112)
CO2: 25 mEq/L (ref 19–32)
Creat: 0.91 mg/dL (ref 0.50–1.35)
GFR, Est Non African American: 88 mL/min
GLUCOSE: 112 mg/dL — AB (ref 70–99)
Potassium: 4 mEq/L (ref 3.5–5.3)
Sodium: 137 mEq/L (ref 135–145)
TOTAL PROTEIN: 7.6 g/dL (ref 6.0–8.3)
Total Bilirubin: 0.8 mg/dL (ref 0.2–1.2)

## 2014-06-08 LAB — LIPID PANEL
CHOLESTEROL: 158 mg/dL (ref 0–200)
HDL: 33 mg/dL — AB (ref >39)
LDL Cholesterol: 87 mg/dL (ref 0–99)
TRIGLYCERIDES: 188 mg/dL — AB (ref <150)
Total CHOL/HDL Ratio: 4.8 Ratio
VLDL: 38 mg/dL (ref 0–40)

## 2014-06-08 LAB — T4, FREE: FREE T4: 1.37 ng/dL (ref 0.80–1.80)

## 2014-06-08 MED ORDER — TRAMADOL HCL 50 MG PO TABS: 50.0000 mg | ORAL_TABLET | Freq: Three times a day (TID) | ORAL | Status: DC | PRN

## 2014-06-08 MED ORDER — DESLORATADINE 5 MG PO TABS: 5.0000 mg | ORAL_TABLET | Freq: Every day | ORAL | Status: DC

## 2014-06-08 MED ORDER — METOPROLOL TARTRATE 50 MG PO TABS: 50.0000 mg | ORAL_TABLET | Freq: Two times a day (BID) | ORAL | Status: DC

## 2014-06-08 MED ORDER — LEVOTHYROXINE SODIUM 112 MCG PO TABS: 112.0000 ug | ORAL_TABLET | Freq: Every day | ORAL | Status: DC

## 2014-06-08 MED ORDER — RAMIPRIL 10 MG PO CAPS: ORAL_CAPSULE | ORAL | Status: DC

## 2014-06-08 NOTE — Patient Instructions (Signed)
[facility] Policy for Prescribing Controlled Substances (Revised 04/2012) 1. Prescriptions for controlled substances will be filled by ONE provider at [facility] with whom you have established and developed a plan for your care, including follow-up. 2. You are encouraged to schedule an appointment with your prescriber at our appointment center for follow-up visits whenever possible. 3. If you request a prescription for the controlled substance while at [facility] for an acute problem (with someone other than your regular prescriber), you MAY be given a ONE-TIME prescription for a 30-day supply of the controlled substance, to allow time for you to return to see your regular prescriber for additional prescriptions. 

## 2014-06-08 NOTE — Progress Notes (Addendum)
   Subjective:    Patient ID: Andre Frieze., male    DOB: 1945-12-27, 68 y.o.   MRN: [id number] This chart was scribed for [name], MD by Zola Button, Medical Scribe. This patient was seen in room 4 and the patient's care was started at 11:27 AM.   HPI HPI Comments: Andre Mineer. is a 68 y.o. male with a hx of arthritis and CABG in 2002 who presents to the [facility] for medication refills. He last ate around 2:00 AM this morning. Patient was to take tramadol every 8 hours for his arthritis, but he wants to increase it to every 6 hours and believes he will not need to sleeping pill if increased. Patient states he has pain coming up from both of his legs. He last saw [name] about 1 year ago.    Review of Systems     Objective:   Physical Exam CONSTITUTIONAL: Well developed/well nourished HEAD: Normocephalic/atraumatic EYES: EOM/PERRL ENMT: Mucous membranes moist NECK: supple no meningeal signs SPINE: entire spine nontender. No tenderness over lumbar spine. CV: S1/S2 noted, no murmurs/rubs/gallops noted. Chest is clear. Sternotomy scar. LUNGS: Lungs are clear to auscultation bilaterally, no apparent distress ABDOMEN: soft, nontender, no rebound or guarding GU: no cva tenderness NEURO: Pt is awake/alert, moves all extremitiesx4. Reflexes 2+. Motor strength symmetrical. EXTREMITIES: pulses normal, full ROM SKIN: warm, color normal PSYCH: no abnormalities of mood noted Results for orders placed or performed in visit on 06/08/14  POCT CBC  Result Value Ref Range   WBC 12.4 (A) 4.6 - 10.2 K/uL   Lymph, poc 2.8 0.6 - 3.4   POC LYMPH PERCENT 22.8 10 - 50 %L   MID (cbc) 0.7 0 - 0.9   POC MID % 5.6 0 - 12 %M   POC Granulocyte 8.9 (A) 2 - 6.9   Granulocyte percent 71.6 37 - 80 %G   RBC 5.55 4.69 - 6.13 M/uL   Hemoglobin 17.1 14.1 - 18.1 g/dL   HCT, POC 52.8 43.5 - 53.7 %   MCV 95.0 80 - 97 fL   MCH, POC 30.8 27 - 31.2 pg   MCHC 32.4 31.8 -  35.4 g/dL   RDW, POC 13.6 %   Platelet Count, POC 266 142 - 424 K/uL   MPV 7.7 0 - 99.8 fL       Assessment & Plan:  I did give him one month of early refill on his Ultram. He was given a total of 120 tablets to last him 28 days. He was advised of our controlled substance policy and he will need an appointment to see [name] before this prescription runs out. Routine labs were done.I personally performed the services described in this documentation, which was scribed in my presence. The recorded information has been reviewed and is accurate.

## 2014-06-11 NOTE — Progress Notes (Signed)
Patient has scheduled an appointment to see Dr Leward Quan on 06/27/14 @ 8:30 am.

## 2014-06-27 ENCOUNTER — Ambulatory Visit (INDEPENDENT_AMBULATORY_CARE_PROVIDER_SITE_OTHER): Payer: Medicare Other | Admitting: Family Medicine

## 2014-06-27 ENCOUNTER — Encounter: Payer: Self-pay | Admitting: Family Medicine

## 2014-06-27 VITALS — BP 168/86 | HR 84 | Temp 97.3°F | Resp 16 | Ht 72.0 in | Wt 227.0 lb

## 2014-06-27 DIAGNOSIS — M25561 Pain in right knee: Secondary | ICD-10-CM

## 2014-06-27 DIAGNOSIS — M2550 Pain in unspecified joint: Secondary | ICD-10-CM

## 2014-06-27 DIAGNOSIS — E890 Postprocedural hypothyroidism: Secondary | ICD-10-CM

## 2014-06-27 DIAGNOSIS — I10 Essential (primary) hypertension: Secondary | ICD-10-CM

## 2014-06-27 DIAGNOSIS — G479 Sleep disorder, unspecified: Secondary | ICD-10-CM

## 2014-06-27 DIAGNOSIS — M25562 Pain in left knee: Secondary | ICD-10-CM

## 2014-06-27 MED ORDER — TRAMADOL HCL 50 MG PO TABS: 50.0000 mg | ORAL_TABLET | Freq: Four times a day (QID) | ORAL | Status: DC | PRN

## 2014-06-27 MED ORDER — GABAPENTIN 100 MG PO CAPS: ORAL_CAPSULE | ORAL | Status: DC

## 2014-06-27 NOTE — Patient Instructions (Signed)
I have refilled Tramadol which is the medication you take for joint pain. Take 1 tablet every 6 hours as needed. Take Tylenol Arthritis strength between doses of Tramadol.  I have prescribed gabapentin to help reduce joint pain. Take 1 capsule every morning and 2 capsules at bedtime.  You should notice less pain by the end of February. I would like to see you again in 3 months.   Contact the clinic when you need Tramadol refill.

## 2014-06-27 NOTE — Progress Notes (Signed)
Subjective:    Patient ID: Andre Frieze., male    DOB: March 26, 1946, 69 y.o.   MRN: [id number]  HPI  This 69 y.o. Male has CAD w/ HTN, controlled on current medications. Pt reports no adverse effects. His main complaint is joint pain, for which he takes Tramadol 1 tablet every 6 hours. This medication does not completely alleviate pain; he takes Arthritis Tylenol between doses of Tramadol but still has pain. He recently stopped taking Meloxicam and BC powders due to GI upset (?PUD).  He is not sleeping well and thinks a sleeping pill may help. Pt states he has "rheumatoid arthritis just beginning in shoulders and other joints". Pt has had no lab work to confirm  This diagnosis. Last lab tests were drawn on 06/08/14 when pt was at 102 for medication refills.  HTN- Elevated reading today but pt attributes this to the lack of sleep. He denies diaphoresis, abnormal weight change, CP or tightness, palpitations, SOB or DOE, cough, HA, dizziness, numbness, weakness or syncope.  Hypothyroidism- Pt is compliant with medication and recent lab results are normal (these were reviewed w/ him).  Patient Active Problem List   Diagnosis Date Noted  . Pain, joint, multiple sites 06/27/2014  . Sleep disorder 06/27/2014  . Change in bowel habits 12/31/2013  . Dysphagia, unspecified(787.20) 12/31/2013  . Esophageal reflux 12/31/2013  . Hypertrophy of prostate without urinary obstruction and other lower urinary tract symptoms (LUTS) 11/26/2012  . Cardiomyopathy, ischemic 11/10/2012  . CAD (coronary artery disease) 07/02/2011  . Obesity 07/02/2011  . Hypothyroidism, postablative   . CARCINOMA, RENAL CELL 10/31/2008  . DYSLIPIDEMIA 10/31/2008  . Essential hypertension 10/31/2008  . MYOCARDIAL INFARCTION 10/31/2008    Prior to Admission medications   Medication Sig Start Date End Date Taking? Authorizing Provider  acetaminophen (TYLENOL) 325 MG tablet Take 325 mg by mouth every 6 (six) hours as needed.    Yes Historical Provider, MD  atorvastatin (LIPITOR) 20 MG tablet Take 20 mg by mouth daily.   Yes Historical Provider, MD  cholecalciferol (VITAMIN D) 1000 UNITS tablet Take 1,000 Units by mouth daily.   Yes Historical Provider, MD  desloratadine (CLARINEX) 5 MG tablet Take 1 tablet (5 mg total) by mouth daily. 06/08/14  Yes [name], MD  levothyroxine (SYNTHROID) 112 MCG tablet Take 1 tablet (112 mcg total) by mouth daily. 06/08/14  Yes [name], MD  metoprolol (LOPRESSOR) 50 MG tablet Take 1 tablet (50 mg total) by mouth 2 (two) times daily. 06/08/14  Yes [name], MD  omeprazole (PRILOSEC) 20 MG capsule Take 20 mg by mouth daily.   Yes Historical Provider, MD  polyethylene glycol powder (GLYCOLAX/MIRALAX) powder Take 17 g by mouth once. Mix in 8 ounces of any liquid. 10/19/13  Yes Wardell Honour, MD  ramipril (ALTACE) 10 MG capsule TAKE 1 CAPSULE TWICE DAILY 06/08/14  Yes [name], MD  tamsulosin (FLOMAX) 0.4 MG CAPS capsule Take 1 capsule (0.4 mg total) by mouth daily. 04/29/14  Yes Robyn Haber, MD  traMADol (ULTRAM) 50 MG tablet Take 1 tablet (50 mg total) by mouth every 6 (six) hours as needed.   Yes [name], MD  triamterene-hydrochlorothiazide (MAXZIDE-25) 37.5-25 MG per tablet Take 1 tablet by mouth daily.   Yes Historical Provider, MD    History   Social History  . Marital Status: Legally Separated    Spouse Name: N/A    Number of Children: N/A  . Years of  Education: N/A   Occupational History  . Retired    Social History Main Topics  . Smoking status: Former Smoker    Types: Cigarettes    Quit date: 10/31/2005  . Smokeless tobacco: Never Used  . Alcohol Use: No  . Drug Use: No  . Sexual Activity: No   Other Topics Concern  . Not on file   Social History Narrative    Review of Systems  Constitutional: Negative.   HENT: Negative.   Respiratory: Negative.   Cardiovascular: Negative.   Genitourinary: Negative.   Musculoskeletal: Positive  for arthralgias. Negative for myalgias, back pain, joint swelling and gait problem.  Skin: Negative.   Neurological: Negative.   Psychiatric/Behavioral: Positive for sleep disturbance.       Objective:   Physical Exam  Constitutional: He is oriented to person, place, and time. He appears well-developed and well-nourished. No distress.  HENT:  Head: Normocephalic and atraumatic.  Eyes: Conjunctivae and EOM are normal. Pupils are equal, round, and reactive to light.  Cardiovascular: Normal rate and regular rhythm.   Pulmonary/Chest: Effort normal. No respiratory distress.  Musculoskeletal:  Degenerative changes in major joints. Gait mildly antalgic.  Neurological: He is alert and oriented to person, place, and time. No cranial nerve deficit. Coordination normal.  Skin: Skin is warm and dry. He is not diaphoretic.  Nursing note and vitals reviewed.   Results for orders placed or performed in visit on 06/08/14  COMPLETE METABOLIC PANEL WITH GFR  Result Value Ref Range   Sodium 137 135 - 145 mEq/L   Potassium 4.0 3.5 - 5.3 mEq/L   Chloride 101 96 - 112 mEq/L   CO2 25 19 - 32 mEq/L   Glucose, Bld 112 (H) 70 - 99 mg/dL   BUN 27 (H) 6 - 23 mg/dL   Creat 0.91 0.50 - 1.35 mg/dL   Total Bilirubin 0.8 0.2 - 1.2 mg/dL   Alkaline Phosphatase 63 39 - 117 U/L   AST 18 0 - 37 U/L   ALT 21 0 - 53 U/L   Total Protein 7.6 6.0 - 8.3 g/dL   Albumin 4.8 3.5 - 5.2 g/dL   Calcium 10.2 8.4 - 10.5 mg/dL   GFR, Est African American >89 mL/min   GFR, Est Non African American 88 mL/min  Lipid panel  Result Value Ref Range   Cholesterol 158 0 - 200 mg/dL   Triglycerides 188 (H) <150 mg/dL   HDL 33 (L) >39 mg/dL   Total CHOL/HDL Ratio 4.8 Ratio   VLDL 38 0 - 40 mg/dL   LDL Cholesterol 87 0 - 99 mg/dL  TSH  Result Value Ref Range   TSH 2.567 0.350 - 4.500 uIU/mL  T4, free  Result Value Ref Range   Free T4 1.37 0.80 - 1.80 ng/dL  POCT CBC  Result Value Ref Range   WBC 12.4 (A) 4.6 - 10.2 K/uL     Lymph, poc 2.8 0.6 - 3.4   POC LYMPH PERCENT 22.8 10 - 50 %L   MID (cbc) 0.7 0 - 0.9   POC MID % 5.6 0 - 12 %M   POC Granulocyte 8.9 (A) 2 - 6.9   Granulocyte percent 71.6 37 - 80 %G   RBC 5.55 4.69 - 6.13 M/uL   Hemoglobin 17.1 14.1 - 18.1 g/dL   HCT, POC 52.8 43.5 - 53.7 %   MCV 95.0 80 - 97 fL   MCH, POC 30.8 27 - 31.2 pg   MCHC 32.4 31.8 -  35.4 g/dL   RDW, POC 13.6 %   Platelet Count, POC 266 142 - 424 K/uL   MPV 7.7 0 - 99.8 fL       Assessment & Plan:  Pain, joint, multiple sites- Continue Tramadol 1 tablet every 6 hours and Arthritis Tylenol betw/ for breakthrough pain. Will add gabapentin for pain reduction.  Knee pain, bilateral - Plan: traMADol (ULTRAM) 50 MG tablet  Sleep disorder- Trial Gabapentin 100 mg 1 capsule every morning and 2 capsules at bedtime.  Essential hypertension- Stable on current medications.  Hypothyroidism, postablative- Continue Levothyroxine 112 mcg  1 tablet daily.  Follow-up in 3 months or sooner prn.

## 2014-07-07 IMAGING — CR DG CHEST 2V
2 series · 2 of 2 positions shown · non-contrast
Comparison: 12/06/2012

CLINICAL DATA: Coronary artery disease

EXAM:
CHEST  2 VIEW

[lateral]
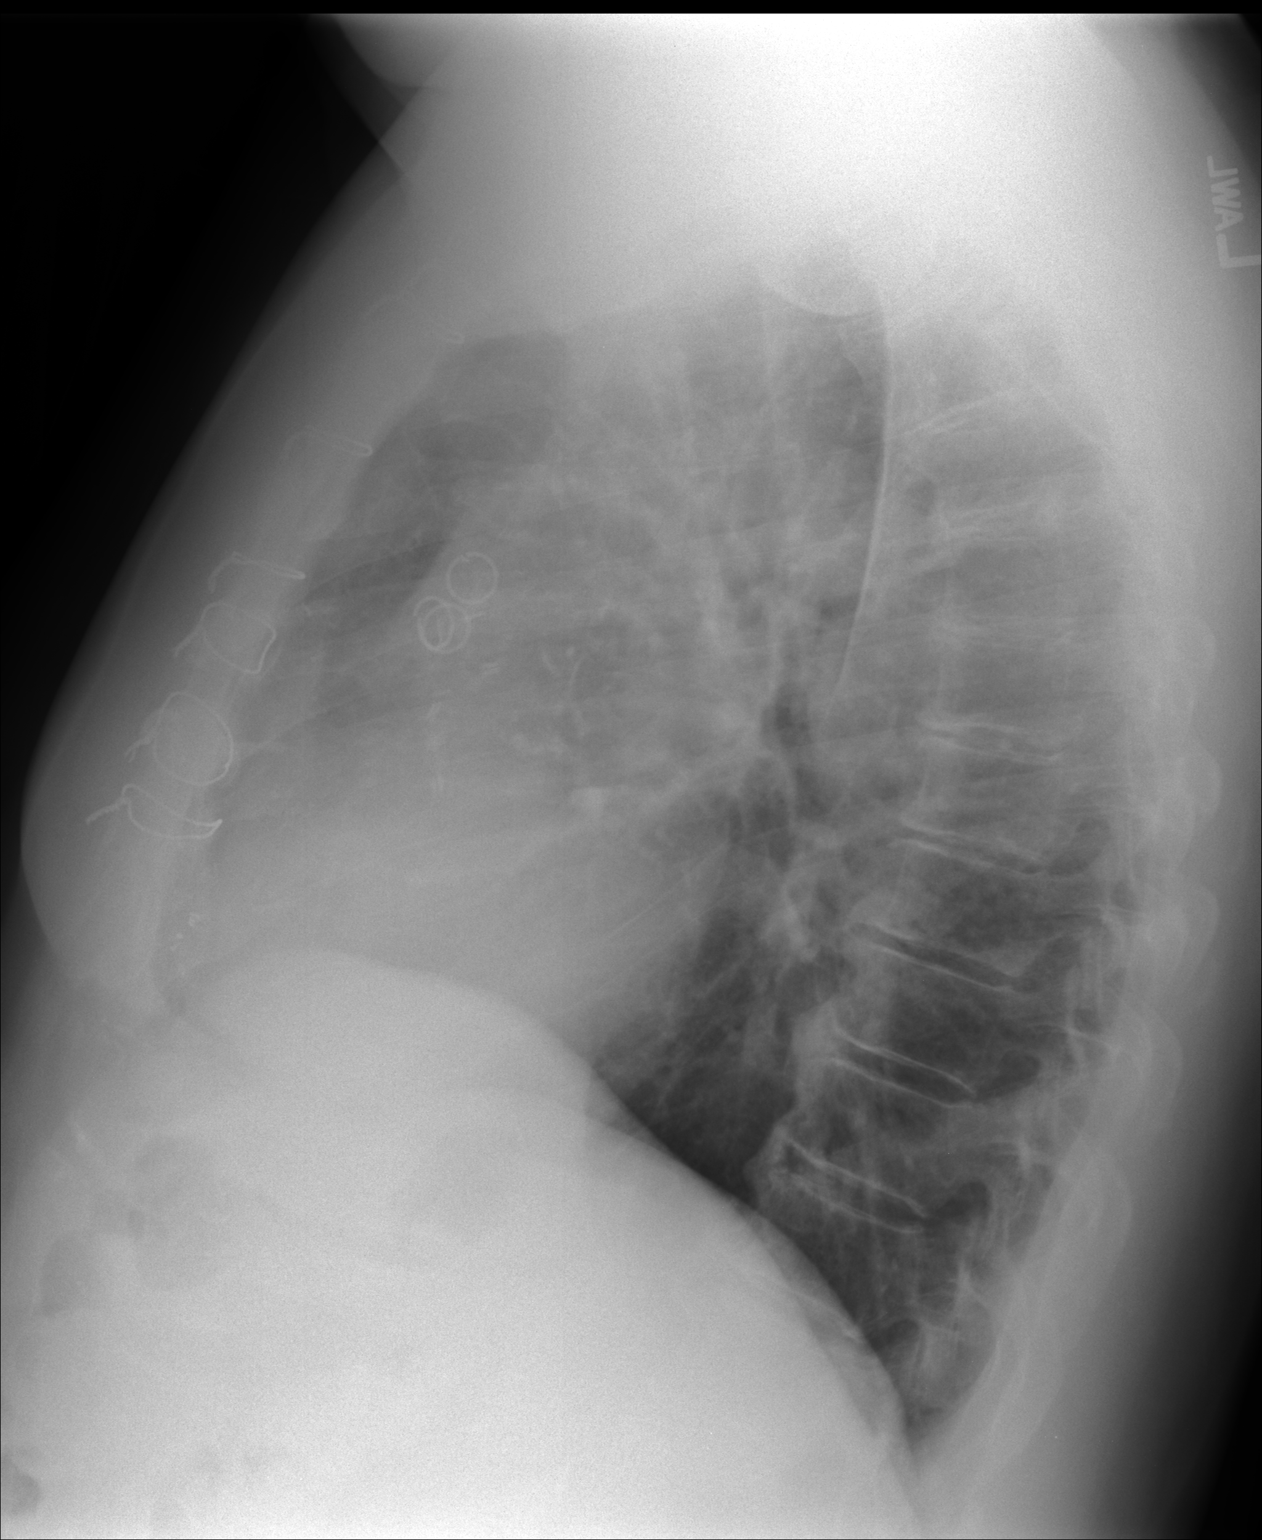

[PA]
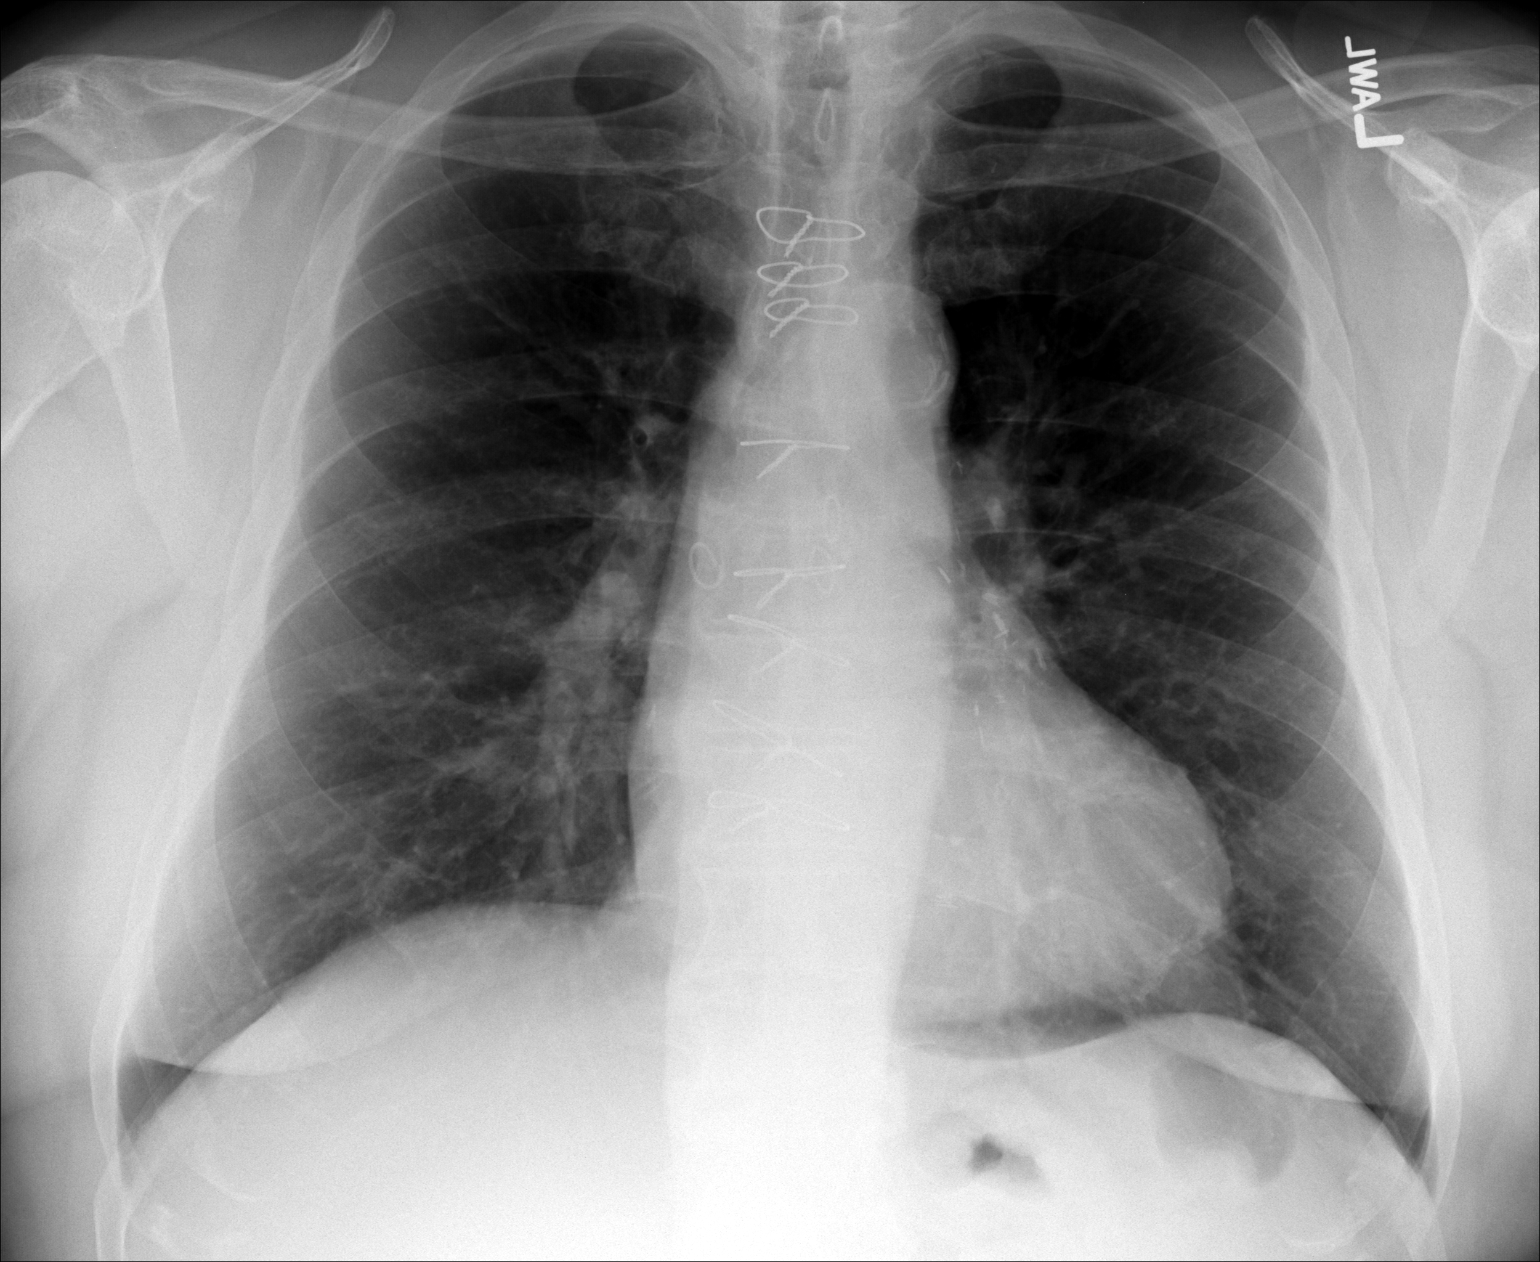

[2 of 2 positions shown; findings below may reference images not displayed]

FINDINGS: Postsurgical changes are again seen. The cardiac shadow is stable.
The lungs are well aerated bilaterally without focal infiltrate or
sizable effusion. No acute bony abnormality is seen.
IMPRESSION: No active cardiopulmonary disease.

## 2014-07-21 ENCOUNTER — Other Ambulatory Visit: Payer: Self-pay | Admitting: Family Medicine

## 2014-08-08 ENCOUNTER — Ambulatory Visit (INDEPENDENT_AMBULATORY_CARE_PROVIDER_SITE_OTHER): Payer: Medicare Other | Admitting: Emergency Medicine

## 2014-08-08 VITALS — BP 128/76 | HR 78 | Temp 97.7°F | Resp 18 | Ht 71.5 in | Wt 238.8 lb

## 2014-08-08 DIAGNOSIS — G894 Chronic pain syndrome: Secondary | ICD-10-CM

## 2014-08-08 DIAGNOSIS — M1990 Unspecified osteoarthritis, unspecified site: Secondary | ICD-10-CM

## 2014-08-08 DIAGNOSIS — M25562 Pain in left knee: Secondary | ICD-10-CM

## 2014-08-08 DIAGNOSIS — M25561 Pain in right knee: Secondary | ICD-10-CM | POA: Diagnosis not present

## 2014-08-08 DIAGNOSIS — M129 Arthropathy, unspecified: Secondary | ICD-10-CM | POA: Diagnosis not present

## 2014-08-08 MED ORDER — TRAMADOL HCL 50 MG PO TABS: 50.0000 mg | ORAL_TABLET | Freq: Four times a day (QID) | ORAL | Status: DC | PRN

## 2014-08-08 NOTE — Progress Notes (Signed)
[facility] [address], [place] [address] [phone]  Date:  08/08/2014   Name:  Andre Cook.   DOB:  [birth year]   MRN:  [id number]  PCP:  Ellsworth Lennox, MD    Chief Complaint: Medication Refill   History of Present Illness:  Andre Cook. is a 69 y.o. very pleasant male patient who presents with the following:  Patient with history of GERD. Unable to tolerate NSAIDS. Comes to office requesting refill of "narcotics". Denies increase in symptoms of pain. No improvement with over the counter medications or other home remedies.  Denies other complaint or health concern today.   Patient Active Problem List   Diagnosis Date Noted  . Pain, joint, multiple sites 06/27/2014  . Sleep disorder 06/27/2014  . Change in bowel habits 12/31/2013  . Dysphagia, unspecified(787.20) 12/31/2013  . Esophageal reflux 12/31/2013  . Hypertrophy of prostate without urinary obstruction and other lower urinary tract symptoms (LUTS) 11/26/2012  . Cardiomyopathy, ischemic 11/10/2012  . CAD (coronary artery disease) 07/02/2011  . Obesity 07/02/2011  . Hypothyroidism, postablative   . CARCINOMA, RENAL CELL 10/31/2008  . DYSLIPIDEMIA 10/31/2008  . Essential hypertension 10/31/2008  . MYOCARDIAL INFARCTION 10/31/2008    Past Medical History  Diagnosis Date  . 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 Hypertension   . CAD (coronary artery disease) 2002    Post- op MI, CABG due to severe 3 vessel disease.  See cardiac cath report 02/02/2001  . Nephrolithiasis   . Dyslipidemia   . GERD (gastroesophageal reflux disease)   . Hyperglycemia   . Obesity   . MI (myocardial infarction) 2002    post- op 2002, severe 3 vessel disease, CABG.  Inferior wall MI  . 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 [name]  . Anxiety   . Heart disease   . Hypercholesterolemia   . Hypertrophy of prostate with urinary obstruction and other  lower urinary tract symptoms (LUTS)   . Allergy   . Arthritis     Past Surgical History  Procedure Laterality Date  . Appendectomy    . Coronary artery bypass graft  2007    4 vessels treated  . Tonsillectomy and adenoidectomy    . Finger amputation      right 4th finger: industrial accident  . 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 Renal biopsy    . Cardiac surgery    . Thyroidectomy      History  Substance Use Topics  . Smoking status: Former Smoker    Types: Cigarettes    Quit date: 10/31/2005  . Smokeless tobacco: Never Used  . Alcohol Use: No    Family History  Problem Relation Age of Onset  . Heart disease Father   . COPD Mother   . Cancer Sister     Primary-  Lung  . Colon cancer Neg Hx   . Esophageal cancer Neg Hx   . Stomach cancer Neg Hx   . Rectal cancer Neg Hx     Allergies  Allergen Reactions  . Ciprofloxacin Rash    Medication list has been reviewed and updated.  Current Outpatient Prescriptions on File Prior to Visit  Medication Sig Dispense Refill  . acetaminophen (TYLENOL) 325 MG tablet Take 325 mg by mouth every 6 (six) hours as needed.    Marland Kitchen atorvastatin (LIPITOR) 20 MG tablet Take 20  mg by mouth daily.    . cholecalciferol (VITAMIN D) 1000 UNITS tablet Take 1,000 Units by mouth daily.    Marland Kitchen desloratadine (CLARINEX) 5 MG tablet Take 1 tablet (5 mg total) by mouth daily. 90 tablet 3  . levothyroxine (SYNTHROID) 112 MCG tablet Take 1 tablet (112 mcg total) by mouth daily. 90 tablet 3  . metoprolol (LOPRESSOR) 50 MG tablet Take 1 tablet (50 mg total) by mouth 2 (two) times daily. 180 tablet 3  . omeprazole (PRILOSEC) 20 MG capsule Take 20 mg by mouth daily.    . polyethylene glycol powder (GLYCOLAX/MIRALAX) powder Take 17 g by mouth once. Mix in 8 ounces of any liquid. 3350 g 1  . ramipril (ALTACE) 10 MG capsule TAKE 1 CAPSULE TWICE DAILY 90 capsule 3  . tamsulosin (FLOMAX) 0.4 MG CAPS capsule Take 1 capsule (0.4 mg  total) by mouth daily. 90 capsule 1  . traMADol (ULTRAM) 50 MG tablet Take 1 tablet (50 mg total) by mouth every 6 (six) hours as needed. 120 tablet 0  . triamterene-hydrochlorothiazide (MAXZIDE-25) 37.5-25 MG per tablet Take 1 tablet by mouth daily.     No current facility-administered medications on file prior to visit.    Review of Systems:  As per HPI, otherwise negative.    Physical Examination: Filed Vitals:   08/08/14 1009  BP: 128/76  Pulse: 78  Temp: 97.7 F (36.5 C)  Resp: 18   Filed Vitals:   08/08/14 1009  Height: 5' 11.5" (1.816 m)  Weight: 238 lb 12.8 oz (108.319 kg)   Body mass index is 32.85 kg/(m^2). Ideal Body Weight: Weight in (lb) to have BMI = 25: 181.4   GEN: WDWN, NAD, Non-toxic, Alert & Oriented x 3 HEENT: Atraumatic, Normocephalic.  Ears and Nose: No external deformity. EXTR: No clubbing/cyanosis/edema NEURO: Normal gait.  PSYCH: Normally interactive. Conversant. Not depressed or anxious appearing.  Calm demeanor.    Assessment and Plan: Polyarthritis No referral to rheumatology noted Refill Follow up Dr Leward Quan  Signed,  Ellison Carwin, MD

## 2014-08-08 NOTE — Patient Instructions (Signed)

## 2014-08-17 ENCOUNTER — Other Ambulatory Visit: Payer: Self-pay | Admitting: Family Medicine

## 2014-08-19 ENCOUNTER — Other Ambulatory Visit: Payer: Self-pay | Admitting: Family Medicine

## 2014-09-07 ENCOUNTER — Ambulatory Visit (INDEPENDENT_AMBULATORY_CARE_PROVIDER_SITE_OTHER): Payer: Medicare Other | Admitting: Emergency Medicine

## 2014-09-07 VITALS — BP 128/60 | HR 63 | Temp 97.3°F | Resp 18 | Ht 71.5 in | Wt 238.0 lb

## 2014-09-07 DIAGNOSIS — M25561 Pain in right knee: Secondary | ICD-10-CM | POA: Diagnosis not present

## 2014-09-07 DIAGNOSIS — I499 Cardiac arrhythmia, unspecified: Secondary | ICD-10-CM | POA: Diagnosis not present

## 2014-09-07 DIAGNOSIS — I493 Ventricular premature depolarization: Secondary | ICD-10-CM | POA: Diagnosis not present

## 2014-09-07 DIAGNOSIS — M25562 Pain in left knee: Secondary | ICD-10-CM | POA: Diagnosis not present

## 2014-09-07 MED ORDER — TRAMADOL HCL 50 MG PO TABS: 50.0000 mg | ORAL_TABLET | Freq: Four times a day (QID) | ORAL | Status: DC | PRN

## 2014-09-07 NOTE — Progress Notes (Addendum)
   Subjective:    Patient ID: Andre Frieze., male    DOB: [birth year], 69 y.o.   MRN: [id number]  This chart was scribed for [name], MD by Stephania Fragmin, ED Scribe. This patient was seen in room 2 and the patient's care was started at 10:11 AM.   HPI  HPI Comments: Andre Blanke. is a 69 y.o. male who presents to the [facility] for a refill of Tramadol. Patient states he has been doing well with the medication, which he takes 1 tablet of every 6 hours. Patient's primary source of pain is in his knees. He reports he has been to a "knee doctor," but denies any need for total knee arthroplasty. Patient states [name] is his PCP, who he is scheduled to see 09/26/14. He notes he had a test for cholesterol yesterday.   Patient had a MI about 13 years ago, in 2003, followed by multiple CABG's. He does not know when he next sees his cardiologist.  Review of Systems     Objective:   Physical Exam  Nursing note and vitals reviewed.  CONSTITUTIONAL: Well developed/well nourished HEAD: Normocephalic/atraumatic EYES: EOMI/PERRL ENMT: Mucous membranes moist NECK: supple no meningeal signs SPINE/BACK:entire spine nontender CV: irregular rhythm LUNGS: Lungs are clear to auscultation bilaterally, no apparent distress ABDOMEN: soft, nontender, no rebound or guarding, bowel sounds noted throughout abdomen GU:no cva tenderness NEURO: Pt is awake/alert/appropriate, moves all extremitiesx4.  No facial droop.   EXTREMITIES: pulses normal/equal, full ROM; degenerative changes of both knees, but no swelling or redness. SKIN: warm, color normal PSYCH: no abnormalities of mood noted, alert and oriented to situation  EKG sinus rhythm with frequent PVCs old inferior infarct nondiagnostic ST changes.    Assessment & Plan:   I refilled his Ultram. Referral placed for Dr. Einar Gip  to see patient regarding his frequent PVCs. These are currently asymptomatic.I personally  performed the services described in this documentation, which was scribed in my presence. The recorded information has been reviewed and is accurate. I did refill his Ultram

## 2014-09-07 NOTE — Patient Instructions (Signed)
[facility] Policy for Prescribing Controlled Substances (Revised 04/2012) 1. Prescriptions for controlled substances will be filled by ONE provider at [facility] with whom you have established and developed a plan for your care, including follow-up. 2. You are encouraged to schedule an appointment with your prescriber at our appointment center for follow-up visits whenever possible. If you request a prescription for the controlled substance while at [facility] for an acute problem (with someone other than your regular prescriber), you MAY be given a ONE-TIME prescription for a 30-day supply of the controlled substance, to allow time for you to return to see your regular prescriber for additional prescriptions.Premature Beats A premature beat is an extra heartbeat that happens earlier than normal. Premature beats are called premature atrial contractions (PACs) or premature ventricular contractions (PVCs) depending on the area of the heart where they start. CAUSES  Premature beats may be brought on by a variety of factors including: Emotional stress. Lack of sleep. Caffeine. Asthma medicines. Stimulants. Herbal teas. Dietary supplements. Alcohol. In most cases, premature beats are not dangerous and are not a sign of serious heart disease. Most patients evaluated for premature beats have completely normal heart function. Rarely, premature beats may be a sign of more significant heart problems or medical illness. SYMPTOMS  Premature beats may cause palpitations. This means you feel like your heart is skipping a beat or beating harder than usual. Sometimes, slight chest pain occurs with premature beats, lasting only a few seconds. This pain has been described as a "flopping" feeling inside the chest. In many cases, premature beats do not cause any symptoms and they are only detected when an electrocardiography test (EKG) or heart monitoring is performed. DIAGNOSIS  Your caregiver may run some tests to evaluate your  heart such as an EKG or echocardiography. You may need to wear a portable heart monitor for several days to record the electrical activity of your heart. Blood testing may also be performed to check your electrolytes and thyroid function. TREATMENT  Premature beats usually go away with rest. If the problem continues, your caregiver will determine a treatment plan for you.  HOME CARE INSTRUCTIONS Get plenty of rest over the next few days until your symptoms improve. Avoid coffee, tea, alcohol, and soda (pop, cola). Do not smoke. SEEK MEDICAL CARE IF: Your symptoms continue after 1 to 2 days of rest. You have new symptoms, such as chest pain or trouble breathing. SEEK IMMEDIATE MEDICAL CARE IF: You have severe chest pain or abdominal pain. You have pain that radiates into the neck, arm, or jaw. You faint or have extreme weakness. You have shortness of breath. Your heartbeat races for more than 5 seconds. MAKE SURE YOU: Understand these instructions. Will watch your condition. Will get help right away if you are not doing well or get worse. Document Released: 07/08/2004 Document Revised: 08/23/2011 Document Reviewed: 02/01/2011 [facility] Patient Information 2015 [place], [place]. This information is not intended to replace advice given to you by your health care provider. Make sure you discuss any questions you have with your health care provider. 3.

## 2014-09-10 ENCOUNTER — Other Ambulatory Visit: Payer: Self-pay | Admitting: Family Medicine

## 2014-09-10 NOTE — Telephone Encounter (Signed)
Dr Leward Quan, don't see discussion of BPH recently, but pt does have appt w/you 09/26/14. Do you want to OK a 90 day RF at mail order, or just 30?

## 2014-09-12 NOTE — Telephone Encounter (Signed)
Tamsulosin refilled - 90-day supply w/ 1 RF.

## 2014-09-25 ENCOUNTER — Other Ambulatory Visit: Payer: Self-pay | Admitting: Family Medicine

## 2014-09-26 ENCOUNTER — Encounter: Payer: Self-pay | Admitting: Family Medicine

## 2014-09-26 ENCOUNTER — Ambulatory Visit (INDEPENDENT_AMBULATORY_CARE_PROVIDER_SITE_OTHER): Payer: Medicare Other | Admitting: Family Medicine

## 2014-09-26 VITALS — BP 130/66 | HR 56 | Temp 97.4°F | Resp 18 | Ht 72.5 in | Wt 240.0 lb

## 2014-09-26 DIAGNOSIS — I10 Essential (primary) hypertension: Secondary | ICD-10-CM

## 2014-09-26 DIAGNOSIS — M25562 Pain in left knee: Secondary | ICD-10-CM | POA: Diagnosis not present

## 2014-09-26 DIAGNOSIS — M25561 Pain in right knee: Secondary | ICD-10-CM | POA: Diagnosis not present

## 2014-09-26 MED ORDER — TRAMADOL HCL 50 MG PO TABS: 50.0000 mg | ORAL_TABLET | Freq: Four times a day (QID) | ORAL | Status: DC | PRN

## 2014-09-29 ENCOUNTER — Encounter: Payer: Self-pay | Admitting: Family Medicine

## 2014-09-29 NOTE — Progress Notes (Signed)
S:  This 69 y.o. Male has well controlled HTN, CAD (S/P CABG in 2002), cardiomyopathy and hypothyroidism. He is compliant w/ all medications w/o adverse effects. He denies abnormal weight gain, fatigue, diaphoresis, CP or tightness, palpitations, SOB or DOE, edema, cough, HA, dizziness, numbness, weakness or syncope. He has chronic bilateral knee pain, effectively controlled w/ Tramadol and acetaminophen. Pt has not been using any topical analgesics. He denies redness or warmth in knees. He limits activity due to pain. Weight has increased significantly since last visit.  Patient Active Problem List   Diagnosis Date Noted  . PVC (premature ventricular contraction) 09/07/2014  . Pain, joint, multiple sites 06/27/2014  . Sleep disorder 06/27/2014  . Change in bowel habits 12/31/2013  . Dysphagia, unspecified(787.20) 12/31/2013  . Esophageal reflux 12/31/2013  . Hypertrophy of prostate without urinary obstruction and other lower urinary tract symptoms (LUTS) 11/26/2012  . Cardiomyopathy, ischemic 11/10/2012  . CAD (coronary artery disease) 07/02/2011  . Obesity 07/02/2011  . Hypothyroidism, postablative   . CARCINOMA, RENAL CELL 10/31/2008  . DYSLIPIDEMIA 10/31/2008  . Essential hypertension 10/31/2008  . MYOCARDIAL INFARCTION 10/31/2008    Prior to Admission medications   Medication Sig Start Date End Date Taking? Authorizing Provider  acetaminophen (TYLENOL) 325 MG tablet Take 325 mg by mouth every 6 (six) hours as needed.   Yes Historical Provider, MD  atorvastatin (LIPITOR) 20 MG tablet Take 20 mg by mouth daily.   Yes Historical Provider, MD  cholecalciferol (VITAMIN D) 1000 UNITS tablet Take 1,000 Units by mouth daily.   Yes Historical Provider, MD  desloratadine (CLARINEX) 5 MG tablet Take 1 tablet (5 mg total) by mouth daily. 09/25/14  Yes [name], MD  levothyroxine (SYNTHROID) 112 MCG tablet Take 1 tablet (112 mcg total) by mouth daily. 06/08/14  Yes [name], MD   metoprolol (LOPRESSOR) 50 MG tablet Take 1 tablet (50 mg total) by mouth 2 (two) times daily. 06/08/14  Yes [name], MD  omeprazole (PRILOSEC) 20 MG capsule Take 20 mg by mouth daily.   Yes Historical Provider, MD  polyethylene glycol powder (GLYCOLAX/MIRALAX) powder Take 17 g by mouth once. Mix in 8 ounces of any liquid. 10/19/13  Yes Wardell Honour, MD  ramipril (ALTACE) 10 MG capsule TAKE 1 CAPSULE TWICE DAILY 06/08/14  Yes [name], MD  tamsulosin (FLOMAX) 0.4 MG CAPS capsule TAKE 1 CAPSULE DAILY 09/12/14  Yes Barton Fanny, MD  traMADol (ULTRAM) 50 MG tablet Take 1 tablet (50 mg total) by mouth every 6 (six) hours as needed.   Yes Barton Fanny, MD  triamterene-hydrochlorothiazide (MAXZIDE-25) 37.5-25 MG per tablet Take 1 tablet by mouth daily.   Yes Historical Provider, MD    SURG, SOC and FAM HX reviewed.  ROS: As per HPI.  O: Filed Vitals:   09/26/14 1609  BP: 130/66  Pulse: 56  Temp: 97.4 F (36.3 C)  Resp: 18    GEN: In NAD; WN,WD. HENT: [place]/AT; EOMI w/ clear conj/sclerae. Otherwise unremarkable. LUNGS: Unlabored resp. SKIN; W&D; intact w/o erythema or pallor. NEURO: A&O x 3; Cns intact. Nonfocal.  A/P: Essential hypertension- Stable on current medications. Pt has been seen by Dr. Johnsie Cancel in the past. I will send a message to Referrals about this.  Knee pain, bilateral - Plan: traMADol (ULTRAM) 50 MG tablet  Meds ordered this encounter  Medications  . traMADol (ULTRAM) 50 MG tablet    Sig: Take 1 tablet (50 mg total) by mouth every  6 (six) hours as needed.    Dispense:  120 tablet    Refill:  1    May fill on or after October 08, 2014

## 2014-10-13 ENCOUNTER — Other Ambulatory Visit: Payer: Self-pay | Admitting: Family Medicine

## 2014-10-29 ENCOUNTER — Ambulatory Visit (INDEPENDENT_AMBULATORY_CARE_PROVIDER_SITE_OTHER): Payer: Medicare Other | Admitting: Cardiology

## 2014-10-29 ENCOUNTER — Encounter: Payer: Self-pay | Admitting: Cardiology

## 2014-10-29 VITALS — BP 138/66 | HR 51 | Ht 72.5 in | Wt 237.4 lb

## 2014-10-29 DIAGNOSIS — I10 Essential (primary) hypertension: Secondary | ICD-10-CM

## 2014-10-29 DIAGNOSIS — I493 Ventricular premature depolarization: Secondary | ICD-10-CM | POA: Diagnosis not present

## 2014-10-29 MED ORDER — ASPIRIN EC 81 MG PO TBEC: 81.0000 mg | DELAYED_RELEASE_TABLET | Freq: Every day | ORAL | Status: AC

## 2014-10-29 NOTE — Patient Instructions (Addendum)
Medication Instructions:  Your physician has recommended you make the following change in your medication:  1) START ASPIRIN 81 mg daily  Labwork: None  Testing/Procedures: None  Follow-Up: Your physician wants you to follow-up in: 6 months with Dr. Radford Pax. You will receive a reminder letter in the mail two months in advance. If you don't receive a letter, please call our office to schedule the follow-up appointment.   Any Other Special Instructions Will Be Listed Below (If Applicable).

## 2014-10-29 NOTE — Progress Notes (Addendum)
Cardiology Office Note   Date:  10/30/2014   ID:  Andre Cook., DOB 1946/02/15, MRN [id number]  PCP:  Ellsworth Lennox, MD    Chief Complaint  Patient presents with  . New Evaluation    Irregular Heart Rhythm      History of Present Illness: Andre Harn. is a 69 y.o. male who presents for reestablishment of Cardiologist.  He has a history of ASCAD with CABG in 2002 with occluded SVG to RCA by cath 05/2008.  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 Most recent echo 06/2014 showed EF 45%.   He was denied an AICD by his insurance initially when EF low but now not indicated.   He also has a history of HTN and dyslipidemia.  He was recently seen in Urgent Care in march for refill of pain meds and was noted to have PVC's on exam.  He is now referred for further evaluation.  He apparently had been followed by Dr. Nadyne Coombes for several years but the patient states that his PCP wanted him to see [facility] so records would be in same system.  The last time he saw Dr. Nadyne Coombes was December 2015.  He denies any chest pain, SOB, DOE, PND, orthopnea, LE edema, dizziness, palpitations or syncope.      Past Medical History  Diagnosis Date  . 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 Hypertension   . Nephrolithiasis   . Dyslipidemia   . GERD (gastroesophageal reflux disease)   . Hyperglycemia   . Obesity   . MI (myocardial infarction) 2002    post- op 2002, severe 3 vessel disease, CABG.  Inferior wall MI  . 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 [name]  . Anxiety   . Heart disease   . Hypercholesterolemia   . Hypertrophy of prostate with urinary obstruction and other lower urinary tract symptoms (LUTS)   . Allergy   . Arthritis   . Cardiomyopathy, ischemic     EF 45% by echo 06/2014  . Mitral regurgitation     mild to moderate by echo 06/2014  . CAD (coronary artery disease) 2002    Post- op MI,  CABG due to severe 3 vessel disease with LIMA to LAD, SVG to D1, SVG to LCX, SVG to PDA.  Repeat cath 05/2008 showed occluded SVG to RCA    Past Surgical History  Procedure Laterality Date  . Appendectomy    . Coronary artery bypass graft  2007    4 vessels treated  . Tonsillectomy and adenoidectomy    . Finger amputation      right 4th finger: industrial accident  . 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 Renal biopsy    . Cardiac surgery    . Thyroidectomy       Current Outpatient Prescriptions  Medication Sig Dispense Refill  . acetaminophen (TYLENOL) 325 MG tablet Take 325 mg by mouth every 6 (six) hours as needed.    Marland Kitchen atorvastatin (LIPITOR) 20 MG tablet Take 20 mg by mouth daily.    . cholecalciferol (VITAMIN D) 1000 UNITS tablet Take 1,000 Units by mouth daily.    . Choline Fenofibrate (FENOFIBRIC ACID) 45 MG CPDR Take 45 mg by mouth daily.    Marland Kitchen desloratadine (CLARINEX) 5 MG tablet Take 1 tablet (5 mg total)  by mouth daily. 90 tablet 2  . Homeopathic Products (ARNICARE ARTHRITIS PO) Take 2 tablets by mouth daily. Dissolve 2 tablets under the tongue every 15 min, for min. Of 3 doses. Then 2 tablets hourly until symptoms improves, per patient.    Marland Kitchen levothyroxine (SYNTHROID) 112 MCG tablet Take 1 tablet (112 mcg total) by mouth daily. 90 tablet 3  . metoprolol (LOPRESSOR) 50 MG tablet Take 1 tablet (50 mg total) by mouth 2 (two) times daily. 180 tablet 3  . omeprazole (PRILOSEC) 20 MG capsule Take 20 mg by mouth daily.    . polyethylene glycol powder (GLYCOLAX/MIRALAX) powder Take 17 g by mouth once. Mix in 8 ounces of any liquid. 3350 g 1  . ramipril (ALTACE) 10 MG capsule TAKE 1 CAPSULE TWICE DAILY 90 capsule 3  . tamsulosin (FLOMAX) 0.4 MG CAPS capsule TAKE 1 CAPSULE DAILY 90 capsule 1  . traMADol (ULTRAM) 50 MG tablet Take 1 tablet (50 mg total) by mouth every 6 (six) hours as needed. 120 tablet 1  . triamterene-hydrochlorothiazide (MAXZIDE-25)  37.5-25 MG per tablet Take 1 tablet by mouth daily.    Marland Kitchen aspirin EC 81 MG tablet Take 1 tablet (81 mg total) by mouth daily. 90 tablet 3   No current facility-administered medications for this visit.    Allergies:   Ciprofloxacin    Social History:  The patient  reports that he quit smoking about 9 years ago. His smoking use included Cigarettes. He has never used smokeless tobacco. He reports that he does not drink alcohol or use illicit drugs.   Family History:  The patient's family history includes COPD in his mother; Cancer in his sister; Heart disease in his father. There is no history of Colon cancer, Esophageal cancer, Stomach cancer, or Rectal cancer.    ROS:  Please see the history of present illness.   Otherwise, review of systems are positive for none.   All other systems are reviewed and negative.    PHYSICAL EXAM: VS:  BP 138/66 mmHg  Pulse 51  Ht 6' 0.5" (1.842 m)  Wt 237 lb 6.4 oz (107.684 kg)  BMI 31.74 kg/m2  SpO2 97% , BMI Body mass index is 31.74 kg/(m^2). GEN: Well nourished, well developed, in no acute distress HEENT: normal Neck: no JVD, carotid bruits, or masses Cardiac: RRR; no murmurs, rubs, or gallops,no edema.  Frequent ectopy Respiratory:  clear to auscultation bilaterally, normal work of breathing GI: soft, nontender, nondistended, + BS MS: no deformity or atrophy Skin: warm and dry, no rash Neuro:  Strength and sensation are intact Psych: euthymic mood, full affect   EKG:  EKG was ordered today and showed sinus bradycardia with incomplete LBBB at 49bpm and inferior infarct   Recent Labs: 06/08/2014: ALT 21; BUN 27*; Creatinine 0.91; Hemoglobin 17.1; Potassium 4.0; Sodium 137; TSH 2.567    Lipid Panel    Component Value Date/Time   CHOL 158 06/08/2014 1153   TRIG 188* 06/08/2014 1153   HDL 33* 06/08/2014 1153   CHOLHDL 4.8 06/08/2014 1153   VLDL 38 06/08/2014 1153   LDLCALC 87 06/08/2014 1153      Wt Readings from Last 3 Encounters:    10/29/14 237 lb 6.4 oz (107.684 kg)  09/26/14 240 lb (108.863 kg)  09/07/14 238 lb (107.956 kg)      ASSESSMENT AND PLAN:  1.  ASCAD s/p remote CABG with known occluded SVG to RCA.  He has no angina on current medical regimen.  Continue statin.  Start ASA 81mg  daily (patient states he has not taken this in the past and has no allergy to it) 2.  Ischemic DCM with EF 45% by echo 06/2014  3.  HTN - controlled on BB/ACE I and HCTZ 4.  Sinus bradycardia - asymptomatic 5.  Dyslipidemia -  Continue statin and fenofibrate.  Check CLP and ALT 6.  PVC's asymptomatic - I will get a 24 Holter to assess PVC load   Current medicines are reviewed at length with the patient today.  The patient does not have concerns regarding medicines.  The following changes have been made:  no change  Labs/ tests ordered today include: see above assessment and plan  Orders Placed This Encounter  Procedures  . EKG 12-Lead     Disposition:   FU with me in 6 months   Signed, [name], MD  10/30/2014 1:28 PM    [facility] [place], [place], [place]  [address] Phone: [phone]; Fax: [phone]

## 2014-10-30 ENCOUNTER — Telehealth: Payer: Self-pay

## 2014-10-30 ENCOUNTER — Encounter: Payer: Self-pay | Admitting: Cardiology

## 2014-10-30 DIAGNOSIS — E785 Hyperlipidemia, unspecified: Secondary | ICD-10-CM

## 2014-10-30 DIAGNOSIS — I340 Nonrheumatic mitral (valve) insufficiency: Secondary | ICD-10-CM | POA: Insufficient documentation

## 2014-10-30 DIAGNOSIS — I493 Ventricular premature depolarization: Secondary | ICD-10-CM

## 2014-10-30 NOTE — Telephone Encounter (Signed)
Dr. Irven Shelling records received.  Per Dr. Radford Pax, a 24 hour holter for PVCs is to be ordered and an FLP and ALT is to be scheduled.  Left message to call back for instructions.

## 2014-10-31 NOTE — Telephone Encounter (Signed)
Follow up  ° ° °Patient returning call back to nurse  °

## 2014-10-31 NOTE — Telephone Encounter (Signed)
Informed patient Dr. Irven Shelling records have been received. 24 hour holter monitor ordered.  Fasting labs to be scheduled the same day. Patient agrees with treatment plan.

## 2014-11-06 ENCOUNTER — Other Ambulatory Visit (INDEPENDENT_AMBULATORY_CARE_PROVIDER_SITE_OTHER): Payer: Medicare Other | Admitting: *Deleted

## 2014-11-06 ENCOUNTER — Ambulatory Visit (INDEPENDENT_AMBULATORY_CARE_PROVIDER_SITE_OTHER): Payer: Medicare Other

## 2014-11-06 DIAGNOSIS — E785 Hyperlipidemia, unspecified: Secondary | ICD-10-CM

## 2014-11-06 DIAGNOSIS — I493 Ventricular premature depolarization: Secondary | ICD-10-CM | POA: Diagnosis not present

## 2014-11-06 LAB — HEPATIC FUNCTION PANEL
ALK PHOS: 66 U/L (ref 39–117)
ALT: 18 U/L (ref 0–53)
AST: 18 U/L (ref 0–37)
Albumin: 4.2 g/dL (ref 3.5–5.2)
Bilirubin, Direct: 0.1 mg/dL (ref 0.0–0.3)
Total Bilirubin: 0.5 mg/dL (ref 0.2–1.2)
Total Protein: 7 g/dL (ref 6.0–8.3)

## 2014-11-06 LAB — LIPID PANEL
Cholesterol: 122 mg/dL (ref 0–200)
HDL: 26.9 mg/dL — ABNORMAL LOW (ref >39.00)
LDL Cholesterol: 66 mg/dL (ref 0–99)
NONHDL: 95.1
Total CHOL/HDL Ratio: 5
Triglycerides: 145 mg/dL (ref 0.0–149.0)
VLDL: 29 mg/dL (ref 0.0–40.0)

## 2014-11-07 ENCOUNTER — Ambulatory Visit (INDEPENDENT_AMBULATORY_CARE_PROVIDER_SITE_OTHER): Payer: Medicare Other | Admitting: Physician Assistant

## 2014-11-07 ENCOUNTER — Encounter: Payer: Self-pay | Admitting: Physician Assistant

## 2014-11-07 ENCOUNTER — Telehealth: Payer: Self-pay | Admitting: Cardiology

## 2014-11-07 VITALS — BP 130/74 | HR 55 | Temp 98.4°F | Resp 16 | Ht 71.75 in | Wt 233.4 lb

## 2014-11-07 DIAGNOSIS — H938X1 Other specified disorders of right ear: Secondary | ICD-10-CM | POA: Diagnosis not present

## 2014-11-07 DIAGNOSIS — H9192 Unspecified hearing loss, left ear: Secondary | ICD-10-CM | POA: Diagnosis not present

## 2014-11-07 MED ORDER — FLUTICASONE PROPIONATE 50 MCG/ACT NA SUSP: 2.0000 | Freq: Every day | NASAL | Status: AC

## 2014-11-07 MED ORDER — AMOXICILLIN 500 MG PO CAPS: 500.0000 mg | ORAL_CAPSULE | Freq: Three times a day (TID) | ORAL | Status: DC

## 2014-11-07 NOTE — Telephone Encounter (Signed)
-----   Message from [name], MD sent at 11/06/2014 10:23 PM EDT ----- Lipids at goal continue current therapy

## 2014-11-07 NOTE — Telephone Encounter (Signed)
Left detailed message with results per patient request.  Instructed patient to call if he has any further questions or concerns.

## 2014-11-07 NOTE — Progress Notes (Signed)
11/10/2014 at 10:51 AM  Andre Cook. / DOB: [birth year] / MRN: [id number]  The patient has CARCINOMA, RENAL CELL; Dyslipidemia; Essential hypertension; MYOCARDIAL INFARCTION; Hypothyroidism, postablative; CAD (coronary artery disease); Obesity; Cardiomyopathy, ischemic; Hypertrophy of prostate without urinary obstruction and other lower urinary tract symptoms (LUTS); Change in bowel habits; Dysphagia, unspecified(787.20); Esophageal reflux; Pain, joint, multiple sites; Sleep disorder; PVC (premature ventricular contraction); and Mitral regurgitation on his problem list.  SUBJECTIVE  Chief complaint: Hearing Problem  Patient here for the complaint of left sided worsening hearing loss that started roughly three years ago after a bout of bronchitis.  He reports that his hearing on the left side has become worse over the last three weeks and he describes a stuffy feeling in the ear.  He has already lost his hearing in his right ear at the age of [age] due to "scarring of the inner ear from mumps."  He would like a referral to ENT for this problem because he is worried about becoming deaf.   He  has a past medical history of Hypothyroidism, postablative; Hypertension; Nephrolithiasis; Dyslipidemia; GERD (gastroesophageal reflux disease); Hyperglycemia; Obesity; MI (myocardial infarction) (2002); Basal cell carcinoma of eyelid (05/2008); Anxiety; Heart disease; Hypercholesterolemia; Hypertrophy of prostate with urinary obstruction and other lower urinary tract symptoms (LUTS); Allergy; Arthritis; Cardiomyopathy, ischemic; Mitral regurgitation; and CAD (coronary artery disease) (2002).    Medications reviewed and updated by myself where necessary, and exist elsewhere in the encounter.   Mr. Barnaby is allergic to ciprofloxacin. He  reports that he quit smoking about 9 years ago. His smoking use included Cigarettes. He has never used smokeless tobacco. He reports that he does not drink alcohol or use  illicit drugs. He  reports that he does not engage in sexual activity. The patient  has past surgical history that includes Appendectomy; Coronary artery bypass graft (2007); Tonsillectomy and adenoidectomy; Finger amputation; Partial nephrectomy (01/2001); Renal biopsy; Cardiac surgery; and Thyroidectomy.  His family history includes COPD in his mother; Cancer in his sister; Heart disease in his father. There is no history of Colon cancer, Esophageal cancer, Stomach cancer, or Rectal cancer.  Review of Systems  Constitutional: Negative for fever and chills.  HENT: Negative for congestion.   Eyes: Negative.   Gastrointestinal: Negative for nausea.  Skin: Negative for itching and rash.  Neurological: Negative for dizziness and headaches.    OBJECTIVE  His  height is 5' 11.75" (1.822 m) and weight is 233 lb 6.4 oz (105.87 kg). His oral temperature is 98.4 F (36.9 C). His blood pressure is 130/74 and his pulse is 55. His respiration is 16 and oxygen saturation is 97%.  The patient's body mass index is 31.89 kg/(m^2).  Physical Exam  Nursing note and vitals reviewed. Constitutional: He is oriented to person, place, and time. He appears well-developed and well-nourished. No distress.  Cardiovascular: Regular rhythm and normal heart sounds.   Respiratory: Effort normal.  Musculoskeletal: Normal range of motion.  Neurological: He is alert and oriented to person, place, and time. No cranial nerve deficit.  Skin: Skin is warm and dry. He is not diaphoretic.  Psychiatric: He has a normal mood and affect. His behavior is normal. Judgment and thought content normal.    No results found for this or any previous visit (from the past 24 hour(s)).  ASSESSMENT & PLAN  [name] was seen today for hearing problem.  Diagnoses and all orders for this visit:  Ear fullness, right: Patient with no change in  symptoms with trial of oxymetazoline in the right ear. s Orders: -     amoxicillin (AMOXIL) 500 MG  capsule; Take 1 capsule (500 mg total) by mouth 3 (three) times daily. -     fluticasone (FLONASE) 50 MCG/ACT nasal spray; Place 2 sprays into both nostrils daily. Take two sprays in each nostril daily. 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     The patient was advised to call or come back to clinic if he does not see an improvement in symptoms, or worsens with the above plan.   Philis Fendt, MHS, PA-C [facility] 11/10/2014 10:51 AM

## 2014-11-07 NOTE — Telephone Encounter (Signed)
Follow Up       Pt returning Katy's phone call. Pt states it is ok to leave detailed message.

## 2014-11-13 ENCOUNTER — Encounter: Payer: Self-pay | Admitting: Cardiology

## 2014-11-13 NOTE — Telephone Encounter (Signed)
This encounter was created in error - please disregard.

## 2014-11-13 NOTE — Telephone Encounter (Signed)
New message  Pt returned call

## 2014-11-21 ENCOUNTER — Other Ambulatory Visit: Payer: Self-pay | Admitting: Emergency Medicine

## 2014-12-08 ENCOUNTER — Ambulatory Visit (INDEPENDENT_AMBULATORY_CARE_PROVIDER_SITE_OTHER): Payer: Medicare Other | Admitting: Family Medicine

## 2014-12-08 VITALS — BP 136/80 | HR 68 | Temp 97.7°F | Resp 20 | Ht 72.5 in | Wt 239.6 lb

## 2014-12-08 DIAGNOSIS — M25561 Pain in right knee: Secondary | ICD-10-CM

## 2014-12-08 DIAGNOSIS — M25562 Pain in left knee: Secondary | ICD-10-CM | POA: Diagnosis not present

## 2014-12-08 MED ORDER — TRAMADOL HCL 50 MG PO TABS: 50.0000 mg | ORAL_TABLET | Freq: Three times a day (TID) | ORAL | Status: DC | PRN

## 2014-12-08 NOTE — Progress Notes (Signed)
[facility] [address], [place] [address] [phone]  Date:  12/08/2014   Name:  Andre Cook.   DOB:  [birth year]   MRN:  [id number]  PCP:  Ellsworth Lennox, MD    Chief Complaint: Medication Refill   History of Present Illness:  Andre Leaming. is a 69 y.o. very pleasant male patient who presents with the following:  He uses tramadol for arthritis and would like a RF of this medication.   He has used this for arthritis for a year or so.  It does help him with his pain.  His pain is worse at night when he is going to bed.  He also used tylenol- used to use a lot of tylenol and BC powders so we started him on the tramadol His worst joints are his bilateral knees.   He has not had any knee surgery.   He takes a tramadol every 6 hours or 4 a day.  However it sounds as though he thinks he has to take it this often even if he does not have severe pain.  There are times he thinks he would be ok with tylenol  He has never had any knee surgery.    He is also seeing the [facility] for most of his care Patient Active Problem List   Diagnosis Date Noted  . Mitral regurgitation   . PVC (premature ventricular contraction) 09/07/2014  . Pain, joint, multiple sites 06/27/2014  . Sleep disorder 06/27/2014  . Change in bowel habits 12/31/2013  . Dysphagia, unspecified(787.20) 12/31/2013  . Esophageal reflux 12/31/2013  . Hypertrophy of prostate without urinary obstruction and other lower urinary tract symptoms (LUTS) 11/26/2012  . Cardiomyopathy, ischemic 11/10/2012  . CAD (coronary artery disease) 07/02/2011  . Obesity 07/02/2011  . Hypothyroidism, postablative   . CARCINOMA, RENAL CELL 10/31/2008  . Dyslipidemia 10/31/2008  . Essential hypertension 10/31/2008  . MYOCARDIAL INFARCTION 10/31/2008    Past Medical History  Diagnosis Date  . 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 Hypertension   . Nephrolithiasis   .  Dyslipidemia   . GERD (gastroesophageal reflux disease)   . Hyperglycemia   . Obesity   . MI (myocardial infarction) 2002    post- op 2002, severe 3 vessel disease, CABG.  Inferior wall MI  . 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 [name]  . Anxiety   . Heart disease   . Hypercholesterolemia   . Hypertrophy of prostate with urinary obstruction and other lower urinary tract symptoms (LUTS)   . Allergy   . Arthritis   . Cardiomyopathy, ischemic     EF 45% by echo 06/2014  . Mitral regurgitation     mild to moderate by echo 06/2014  . CAD (coronary artery disease) 2002    Post- op MI, CABG due to severe 3 vessel disease with LIMA to LAD, SVG to D1, SVG to LCX, SVG to PDA.  Repeat cath 05/2008 showed occluded SVG to RCA    Past Surgical History  Procedure Laterality Date  . Appendectomy    . Coronary artery bypass graft  2007    4 vessels treated  . Tonsillectomy and adenoidectomy    . Finger amputation      right 4th finger: industrial accident  . 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 Renal biopsy    . Cardiac surgery    .  Thyroidectomy      History  Substance Use Topics  . Smoking status: Former Smoker    Types: Cigarettes    Quit date: 10/31/2005  . Smokeless tobacco: Never Used  . Alcohol Use: No    Family History  Problem Relation Age of Onset  . Heart disease Father   . COPD Mother   . Cancer Sister     Primary-  Lung  . Colon cancer Neg Hx   . Esophageal cancer Neg Hx   . Stomach cancer Neg Hx   . Rectal cancer Neg Hx     Allergies  Allergen Reactions  . Ciprofloxacin Rash    Medication list has been reviewed and updated.  Current Outpatient Prescriptions on File Prior to Visit  Medication Sig Dispense Refill  . acetaminophen (TYLENOL) 325 MG tablet Take 325 mg by mouth every 6 (six) hours as needed.    Marland Kitchen aspirin EC 81 MG tablet Take 1 tablet (81 mg total) by mouth daily. 90 tablet 3  . atorvastatin (LIPITOR) 20  MG tablet Take 20 mg by mouth daily.    . cholecalciferol (VITAMIN D) 1000 UNITS tablet Take 1,000 Units by mouth daily.    . Choline Fenofibrate (FENOFIBRIC ACID) 45 MG CPDR Take 45 mg by mouth daily.    Marland Kitchen desloratadine (CLARINEX) 5 MG tablet Take 1 tablet (5 mg total) by mouth daily. 90 tablet 2  . fluticasone (FLONASE) 50 MCG/ACT nasal spray Place 2 sprays into both nostrils daily. Take two sprays in each nostril daily. 16 g 12  . Homeopathic Products (ARNICARE ARTHRITIS PO) Take 2 tablets by mouth daily. Dissolve 2 tablets under the tongue every 15 min, for min. Of 3 doses. Then 2 tablets hourly until symptoms improves, per patient.    Marland Kitchen levothyroxine (SYNTHROID) 112 MCG tablet Take 1 tablet (112 mcg total) by mouth daily. 90 tablet 3  . metoprolol (LOPRESSOR) 50 MG tablet Take 1 tablet (50 mg total) by mouth 2 (two) times daily. 180 tablet 3  . omeprazole (PRILOSEC) 20 MG capsule Take 20 mg by mouth daily.    . polyethylene glycol powder (GLYCOLAX/MIRALAX) powder Take 17 g by mouth once. Mix in 8 ounces of any liquid. 3350 g 1  . ramipril (ALTACE) 10 MG capsule TAKE 1 CAPSULE TWICE DAILY 90 capsule 3  . tamsulosin (FLOMAX) 0.4 MG CAPS capsule TAKE 1 CAPSULE DAILY 90 capsule 1  . traMADol (ULTRAM) 50 MG tablet Take 1 tablet (50 mg total) by mouth every 6 (six) hours as needed. 120 tablet 1  . triamterene-hydrochlorothiazide (MAXZIDE-25) 37.5-25 MG per tablet Take 1 tablet by mouth daily.     No current facility-administered medications on file prior to visit.    Review of Systems:  As per HPI- otherwise negative.   Physical Examination: Filed Vitals:   12/08/14 0922  BP: 136/80  Pulse: 68  Temp: 97.7 F (36.5 C)  Resp: 20   Filed Vitals:   12/08/14 0922  Height: 6' 0.5" (1.842 m)  Weight: 239 lb 9.6 oz (108.682 kg)   Body mass index is 32.03 kg/(m^2). Ideal Body Weight: Weight in (lb) to have BMI = 25: 186.5  GEN: WDWN, NAD, Non-toxic, A & O x 3, obese, looks well HEENT:  Atraumatic, Normocephalic. Neck supple. No masses, No LAD. Ears and Nose: No external deformity. CV: RRR, No M/G/R. No JVD. No thrill. No extra heart sounds. PULM: CTA B, no wheezes, crackles, rhonchi. No retractions. No resp. distress. No accessory muscle  use. EXTR: No c/c/e NEURO Normal gait.  PSYCH: Normally interactive. Conversant. Not depressed or anxious appearing.  Calm demeanor.  Both knees show moderate creptius, no effusion. Normal BLE strength   Reviewed controlled substance database which shows only tramadol rx by our office  Assessment and Plan: Knee pain, bilateral - Plan: traMADol (ULTRAM) 50 MG tablet  Refilled his tramadol.  However he states he does not have much pain during the day. It is worse at night.  He also supplements iwht some tyelnol.  Encouraged him to try and dectease his tramadol to TID  Signed Lamar Blinks, MD

## 2014-12-08 NOTE — Patient Instructions (Signed)
I refilled your tramadol today- we reduced the dose to every 8 hours (three times a day) as needed for pain If you do not have severe pain you do NOT have to take the tramadol. It is ok to supplement with tylenol-  Just don't take more tylenol then directed by your tylenol bottle Remember that tramadol is a narcotic pain medication that can be addictive so the less you use of it the better Take care!

## 2014-12-09 ENCOUNTER — Other Ambulatory Visit: Payer: Self-pay | Admitting: Emergency Medicine

## 2015-01-09 ENCOUNTER — Encounter: Payer: Self-pay | Admitting: Gastroenterology

## 2015-01-27 ENCOUNTER — Encounter: Payer: Self-pay | Admitting: Family Medicine

## 2015-01-27 ENCOUNTER — Ambulatory Visit (INDEPENDENT_AMBULATORY_CARE_PROVIDER_SITE_OTHER): Payer: Medicare Other | Admitting: Family Medicine

## 2015-01-27 VITALS — BP 162/80 | HR 52 | Temp 97.8°F | Resp 16 | Ht 72.0 in | Wt 242.8 lb

## 2015-01-27 DIAGNOSIS — E785 Hyperlipidemia, unspecified: Secondary | ICD-10-CM

## 2015-01-27 DIAGNOSIS — Z125 Encounter for screening for malignant neoplasm of prostate: Secondary | ICD-10-CM | POA: Diagnosis not present

## 2015-01-27 DIAGNOSIS — R739 Hyperglycemia, unspecified: Secondary | ICD-10-CM

## 2015-01-27 DIAGNOSIS — E039 Hypothyroidism, unspecified: Secondary | ICD-10-CM | POA: Diagnosis not present

## 2015-01-27 DIAGNOSIS — Z0000 Encounter for general adult medical examination without abnormal findings: Secondary | ICD-10-CM

## 2015-01-27 DIAGNOSIS — N400 Enlarged prostate without lower urinary tract symptoms: Secondary | ICD-10-CM

## 2015-01-27 DIAGNOSIS — Z5181 Encounter for therapeutic drug level monitoring: Secondary | ICD-10-CM | POA: Diagnosis not present

## 2015-01-27 DIAGNOSIS — Z131 Encounter for screening for diabetes mellitus: Secondary | ICD-10-CM | POA: Diagnosis not present

## 2015-01-27 DIAGNOSIS — I10 Essential (primary) hypertension: Secondary | ICD-10-CM

## 2015-01-27 LAB — CBC
HEMATOCRIT: 46.1 % (ref 39.0–52.0)
Hemoglobin: 15.6 g/dL (ref 13.0–17.0)
MCH: 30.5 pg (ref 26.0–34.0)
MCHC: 33.8 g/dL (ref 30.0–36.0)
MCV: 90 fL (ref 78.0–100.0)
MPV: 10.1 fL (ref 8.6–12.4)
PLATELETS: 299 10*3/uL (ref 150–400)
RBC: 5.12 MIL/uL (ref 4.22–5.81)
RDW: 13.5 % (ref 11.5–15.5)
WBC: 8.7 10*3/uL (ref 4.0–10.5)

## 2015-01-27 LAB — BASIC METABOLIC PANEL
BUN: 20 mg/dL (ref 7–25)
CHLORIDE: 101 mmol/L (ref 98–110)
CO2: 28 mmol/L (ref 20–31)
Calcium: 10.3 mg/dL (ref 8.6–10.3)
Creat: 1.08 mg/dL (ref 0.70–1.25)
Glucose, Bld: 111 mg/dL — ABNORMAL HIGH (ref 65–99)
POTASSIUM: 4.5 mmol/L (ref 3.5–5.3)
SODIUM: 139 mmol/L (ref 135–146)

## 2015-01-27 LAB — TSH: TSH: 3.592 u[IU]/mL (ref 0.350–4.500)

## 2015-01-27 MED ORDER — RAMIPRIL 10 MG PO CAPS: ORAL_CAPSULE | ORAL | Status: DC

## 2015-01-27 NOTE — Progress Notes (Signed)
[facility] [address] [place] [address] [phone]  Date:  01/27/2015   Name:  Andre Cook.   DOB:  07-12-1945   MRN:  [id number]  PCP:  Ellsworth Lennox, MD    Chief Complaint: Annual Exam   History of Present Illness:  Andre Cook. is a 69 y.o. very pleasant male patient who presents with the following:  He is here today for a CPE.  He also does attend the [place], but has not had recent labs His cardiologist is Dr. Radford Pax- he has a remote history of CABG, no current angina, on statin, aspirin He has a known histoyr of HTN- out of his ramipirl for a few days which may be why his BP is up He is on triamterene/ hctz and metoprolol as well  He had a cholesterol panel in may of this year per cardiology.   He is due for a TSH check, PSA check Stable dose of thyroid medication for some time   He has been out of ramipril for about one month   Patient Active Problem List   Diagnosis Date Noted  . Mitral regurgitation   . PVC (premature ventricular contraction) 09/07/2014  . Pain, joint, multiple sites 06/27/2014  . Sleep disorder 06/27/2014  . Change in bowel habits 12/31/2013  . Dysphagia, unspecified(787.20) 12/31/2013  . Esophageal reflux 12/31/2013  . Hypertrophy of prostate without urinary obstruction and other lower urinary tract symptoms (LUTS) 11/26/2012  . Cardiomyopathy, ischemic 11/10/2012  . CAD (coronary artery disease) 07/02/2011  . Obesity 07/02/2011  . Hypothyroidism, postablative   . CARCINOMA, RENAL CELL 10/31/2008  . Dyslipidemia 10/31/2008  . Essential hypertension 10/31/2008  . MYOCARDIAL INFARCTION 10/31/2008    Past Medical History  Diagnosis Date  . 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 Hypertension   . Nephrolithiasis   . Dyslipidemia   . GERD (gastroesophageal reflux disease)   . Hyperglycemia   . Obesity   . MI (myocardial infarction) 2002    post- op 2002, severe 3  vessel disease, CABG.  Inferior wall MI  . 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 [name]  . Anxiety   . Heart disease   . Hypercholesterolemia   . Hypertrophy of prostate with urinary obstruction and other lower urinary tract symptoms (LUTS)   . Allergy   . Arthritis   . Cardiomyopathy, ischemic     EF 45% by echo 06/2014  . Mitral regurgitation     mild to moderate by echo 06/2014  . CAD (coronary artery disease) 2002    Post- op MI, CABG due to severe 3 vessel disease with LIMA to LAD, SVG to D1, SVG to LCX, SVG to PDA.  Repeat cath 05/2008 showed occluded SVG to RCA    Past Surgical History  Procedure Laterality Date  . Appendectomy    . Coronary artery bypass graft  2007    4 vessels treated  . Tonsillectomy and adenoidectomy    . Finger amputation      right 4th finger: industrial accident  . 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 Renal biopsy    . Cardiac surgery    . Thyroidectomy      Social History  Substance Use Topics  . Smoking status: Former Smoker    Types: Cigarettes    Quit date: 10/31/2005  . Smokeless tobacco: Never Used  .  Alcohol Use: No    Family History  Problem Relation Age of Onset  . Heart disease Father   . COPD Mother   . Cancer Sister     Primary-  Lung  . Colon cancer Neg Hx   . Esophageal cancer Neg Hx   . Stomach cancer Neg Hx   . Rectal cancer Neg Hx     Allergies  Allergen Reactions  . Ciprofloxacin Rash    Medication list has been reviewed and updated.  Current Outpatient Prescriptions on File Prior to Visit  Medication Sig Dispense Refill  . acetaminophen (TYLENOL) 325 MG tablet Take 325 mg by mouth every 6 (six) hours as needed.    Marland Kitchen aspirin EC 81 MG tablet Take 1 tablet (81 mg total) by mouth daily. 90 tablet 3  . atorvastatin (LIPITOR) 20 MG tablet Take 20 mg by mouth daily.    . cholecalciferol (VITAMIN D) 1000 UNITS tablet Take 1,000 Units by mouth daily.    . Choline Fenofibrate  (FENOFIBRIC ACID) 45 MG CPDR Take 45 mg by mouth daily.    Marland Kitchen desloratadine (CLARINEX) 5 MG tablet Take 1 tablet (5 mg total) by mouth daily. 90 tablet 2  . fluticasone (FLONASE) 50 MCG/ACT nasal spray Place 2 sprays into both nostrils daily. Take two sprays in each nostril daily. 16 g 12  . Homeopathic Products (ARNICARE ARTHRITIS PO) Take 2 tablets by mouth daily. Dissolve 2 tablets under the tongue every 15 min, for min. Of 3 doses. Then 2 tablets hourly until symptoms improves, per patient.    Marland Kitchen levothyroxine (SYNTHROID) 112 MCG tablet Take 1 tablet (112 mcg total) by mouth daily. 90 tablet 3  . metoprolol (LOPRESSOR) 50 MG tablet Take 1 tablet (50 mg total) by mouth 2 (two) times daily. 180 tablet 3  . omeprazole (PRILOSEC) 20 MG capsule Take 20 mg by mouth daily.    . polyethylene glycol powder (GLYCOLAX/MIRALAX) powder Take 17 g by mouth once. Mix in 8 ounces of any liquid. 3350 g 1  . tamsulosin (FLOMAX) 0.4 MG CAPS capsule TAKE 1 CAPSULE DAILY 90 capsule 1  . traMADol (ULTRAM) 50 MG tablet Take 1 tablet (50 mg total) by mouth 3 (three) times daily as needed. 90 tablet 3  . triamterene-hydrochlorothiazide (MAXZIDE-25) 37.5-25 MG per tablet Take 1 tablet by mouth daily.     No current facility-administered medications on file prior to visit.    Review of Systems:  As per HPI- otherwise negative.   Physical Examination: Filed Vitals:   01/27/15 0951  BP: 162/80  Pulse: 52  Temp: 97.8 F (36.6 C)  Resp: 16   Filed Vitals:   01/27/15 0951  Height: 6' (1.829 m)  Weight: 242 lb 12.8 oz (110.133 kg)   Body mass index is 32.92 kg/(m^2). Ideal Body Weight: Weight in (lb) to have BMI = 25: 183.9  GEN: WDWN, NAD, Non-toxic, A & O x 3, obese, looks well HEENT: Atraumatic, Normocephalic. Neck supple. No masses, No LAD. Ears and Nose: No external deformity. CV: RRR, No M/G/R. No JVD. No thrill. No extra heart sounds. PULM: CTA B, no wheezes, crackles, rhonchi. No retractions. No  resp. distress. No accessory muscle use. ABD: S, NT, ND, +BS. No rebound. No HSM. EXTR: No c/c/e NEURO Normal gait.  PSYCH: Normally interactive. Conversant. Not depressed or anxious appearing.  Calm demeanor.  GU: normal testicles, penis and DRE  Assessment and Plan: Physical exam  Hypothyroidism, unspecified hypothyroidism type - Plan: TSH  Dyslipidemia  BPH (benign prostatic hyperplasia) - Plan: PSA, Medicare  Screening for prostate cancer - Plan: PSA, Medicare  Medication monitoring encounter - Plan: CBC  Benign essential HTN - Plan: Basic metabolic panel, ramipril (ALTACE) 10 MG capsule  He has been out of his ramipril- likely the cause of elevated BP today Restart this medication OW doing well and health maint up to date Await labs  Signed Lamar Blinks, MD

## 2015-01-27 NOTE — Patient Instructions (Signed)
Let's ger you back on your ramipril to better control your blood pressure Otherwise things look good and I will be in touch with your labs

## 2015-01-28 ENCOUNTER — Encounter: Payer: Self-pay | Admitting: Family Medicine

## 2015-01-28 LAB — PSA, MEDICARE: PSA: 1.89 ng/mL (ref <=4.00)

## 2015-01-28 NOTE — Addendum Note (Signed)
Addended by: Lamar Blinks C on: 01/28/2015 01:27 PM   Modules accepted: Orders

## 2015-04-09 ENCOUNTER — Other Ambulatory Visit: Payer: Self-pay

## 2015-04-09 MED ORDER — TAMSULOSIN HCL 0.4 MG PO CAPS: 0.4000 mg | ORAL_CAPSULE | Freq: Every day | ORAL | Status: DC

## 2015-04-30 ENCOUNTER — Ambulatory Visit (INDEPENDENT_AMBULATORY_CARE_PROVIDER_SITE_OTHER): Payer: Medicare Other | Admitting: Cardiology

## 2015-04-30 ENCOUNTER — Encounter: Payer: Self-pay | Admitting: Cardiology

## 2015-04-30 VITALS — BP 142/70 | HR 52 | Ht 71.0 in | Wt 246.0 lb

## 2015-04-30 DIAGNOSIS — I493 Ventricular premature depolarization: Secondary | ICD-10-CM | POA: Diagnosis not present

## 2015-04-30 DIAGNOSIS — I2583 Coronary atherosclerosis due to lipid rich plaque: Principal | ICD-10-CM

## 2015-04-30 DIAGNOSIS — I2510 Atherosclerotic heart disease of native coronary artery without angina pectoris: Secondary | ICD-10-CM

## 2015-04-30 DIAGNOSIS — I255 Ischemic cardiomyopathy: Secondary | ICD-10-CM

## 2015-04-30 DIAGNOSIS — E785 Hyperlipidemia, unspecified: Secondary | ICD-10-CM

## 2015-04-30 DIAGNOSIS — I10 Essential (primary) hypertension: Secondary | ICD-10-CM

## 2015-04-30 NOTE — Progress Notes (Signed)
Cardiology Office Note   Date:  04/30/2015   ID:  Andre Frieze., DOB 05-31-1946, MRN [id number]  PCP:  Andre Lennox, MD    Chief Complaint  Patient presents with  . Coronary Artery Disease  . Hypertension  . Cardiomyopathy      History of Present Illness: Andre Cook. is a 69 y.o. male who presents for followup. He has a history of ASCAD with CABG in 2002 with occluded SVG to RCA by cath 05/2008. 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 Most recent echo 06/2014 showed EF 45%. He was denied an AICD by his insurance initially when EF low but now not indicated. He also has a history of HTN and dyslipidemia. he also has a history of PVC's. He denies any chest pain, SOB, DOE, PND, orthopnea, LE edema, dizziness, palpitations or syncope.      Past Medical History  Diagnosis Date  . 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 Hypertension   . Nephrolithiasis   . Dyslipidemia   . GERD (gastroesophageal reflux disease)   . Hyperglycemia   . Obesity   . MI (myocardial infarction) ([place]) 2002    post- op 2002, severe 3 vessel disease, CABG.  Inferior wall MI  . 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 Rosendo Cook  . Anxiety   . Heart disease   . Hypercholesterolemia   . Hypertrophy of prostate with urinary obstruction and other lower urinary tract symptoms (LUTS)   . Allergy   . Arthritis   . Cardiomyopathy, ischemic     EF 45% by echo 06/2014  . Mitral regurgitation     mild to moderate by echo 06/2014  . CAD (coronary artery disease) 2002    Post- op MI, CABG due to severe 3 vessel disease with LIMA to LAD, SVG to D1, SVG to LCX, SVG to PDA.  Repeat cath 05/2008 showed occluded SVG to RCA    Past Surgical History  Procedure Laterality Date  . Appendectomy    . Coronary artery bypass graft  2007    4 vessels treated  . Tonsillectomy and  adenoidectomy    . Finger amputation      right 4th finger: industrial accident  . 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 Risa Cook, clear cell carcinoma  . Renal biopsy    . Cardiac surgery    . Thyroidectomy       Current Outpatient Prescriptions  Medication Sig Dispense Refill  . acetaminophen (TYLENOL) 325 MG tablet Take 325 mg by mouth every 6 (six) hours as needed.    Marland Kitchen aspirin EC 81 MG tablet Take 1 tablet (81 mg total) by mouth daily. 90 tablet 3  . atorvastatin (LIPITOR) 20 MG tablet Take 20 mg by mouth daily.    . cholecalciferol (VITAMIN D) 1000 UNITS tablet Take 1,000 Units by mouth daily.    . Choline Fenofibrate (FENOFIBRIC ACID) 45 MG CPDR Take 45 mg by mouth daily.    Marland Kitchen desloratadine (CLARINEX) 5 MG tablet Take 1 tablet (5 mg total) by mouth daily. 90 tablet 2  . fluticasone (FLONASE) 50 MCG/ACT nasal spray Place 2 sprays into both nostrils daily. Take two sprays in each nostril daily. 16 g 12  . Homeopathic Products (ARNICARE ARTHRITIS PO) Take 2  tablets by mouth daily. Dissolve 2 tablets under the tongue every 15 min, for min. Of 3 doses. Then 2 tablets hourly until symptoms improves, per patient.    Marland Kitchen levothyroxine (SYNTHROID) 112 MCG tablet Take 1 tablet (112 mcg total) by mouth daily. 90 tablet 3  . metoprolol (LOPRESSOR) 50 MG tablet Take 1 tablet (50 mg total) by mouth 2 (two) times daily. 180 tablet 3  . omeprazole (PRILOSEC) 20 MG capsule Take 20 mg by mouth daily.    . ramipril (ALTACE) 10 MG capsule Take 10 mg by mouth 2 (two) times daily.    . Sennosides (EX-LAX) 15 MG TABS Take 15 mg by mouth daily as needed (as needed for contipation).    . tamsulosin (FLOMAX) 0.4 MG CAPS capsule Take 1 capsule (0.4 mg total) by mouth daily. 90 capsule 1  . traMADol (ULTRAM) 50 MG tablet Take 1 tablet (50 mg total) by mouth 3 (three) times daily as needed. 90 tablet 3  . triamterene-hydrochlorothiazide (MAXZIDE-25) 37.5-25 MG per tablet Take 1 tablet by mouth daily.     No current  facility-administered medications for this visit.    Allergies:   Ciprofloxacin    Social History:  The patient  reports that he quit smoking about 9 years ago. His smoking use included Cigarettes. He has never used smokeless tobacco. He reports that he does not drink alcohol or use illicit drugs.   Family History:  The patient's family history includes COPD in his mother; Cancer in his sister; Heart disease in his father. There is no history of Colon cancer, Esophageal cancer, Stomach cancer, or Rectal cancer.    ROS:  Please see the history of present illness.   Otherwise, review of systems are positive for none.   All other systems are reviewed and negative.    PHYSICAL EXAM: VS:  BP 142/70 mmHg  Pulse 52  Ht 5\' 11"  (1.803 m)  Wt 111.585 kg (246 lb)  BMI 34.33 kg/m2 , BMI Body mass index is 34.33 kg/(m^2). GEN: Well nourished, well developed, in no acute distress HEENT: normal Neck: no JVD, carotid bruits, or masses Cardiac: RRR; no murmurs, rubs, or gallops,no edema  Respiratory:  clear to auscultation bilaterally, normal work of breathing GI: soft, nontender, nondistended, + BS MS: no deformity or atrophy Skin: warm and dry, no rash Neuro:  Strength and sensation are intact Psych: euthymic mood, full affect   EKG:  EKG is not ordered today.    Recent Labs: 11/06/2014: ALT 18 01/27/2015: BUN 20; Creat 1.08; Hemoglobin 15.6; Platelets 299; Potassium 4.5; Sodium 139; TSH 3.592    Lipid Panel    Component Value Date/Time   CHOL 122 11/06/2014 1114   TRIG 145.0 11/06/2014 1114   HDL 26.90* 11/06/2014 1114   CHOLHDL 5 11/06/2014 1114   VLDL 29.0 11/06/2014 1114   LDLCALC 66 11/06/2014 1114      Wt Readings from Last 3 Encounters:  04/30/15 111.585 kg (246 lb)  01/27/15 110.133 kg (242 lb 12.8 oz)  12/08/14 108.682 kg (239 lb 9.6 oz)     ASSESSMENT AND PLAN:  1. ASCAD s/p remote CABG with known occluded SVG to RCA. He has no angina on current medical  regimen. Continue statin. Continue SA 81mg  daily. 2. Ischemic DCM with EF 45% by echo 06/2014  3. HTN - controlled on BB/ACE I and Maxide 4. Sinus bradycardia - asymptomatic 5. Dyslipidemia - Continue statin and fenofibrate. Check FLP and ALT 6. PVC's asymptomatic - PVC load  by Holter 1.9%   Current medicines are reviewed at length with the patient today.  The patient does not have concerns regarding medicines.  The following changes have been made:  no change  Labs/ tests ordered today: See above Assessment and Plan No orders of the defined types were placed in this encounter.     Disposition:   FU with me in 6 months  Signed, [name], MD  04/30/2015 1:37 PM    [facility] [address], [place], [place]  [address] Phone: [phone]; Fax: [phone]

## 2015-04-30 NOTE — Patient Instructions (Signed)

## 2015-05-02 ENCOUNTER — Other Ambulatory Visit (INDEPENDENT_AMBULATORY_CARE_PROVIDER_SITE_OTHER): Payer: Medicare Other | Admitting: *Deleted

## 2015-05-02 DIAGNOSIS — E785 Hyperlipidemia, unspecified: Secondary | ICD-10-CM

## 2015-05-02 DIAGNOSIS — I10 Essential (primary) hypertension: Secondary | ICD-10-CM | POA: Diagnosis not present

## 2015-05-02 LAB — BASIC METABOLIC PANEL
BUN: 29 mg/dL — AB (ref 7–25)
CHLORIDE: 103 mmol/L (ref 98–110)
CO2: 24 mmol/L (ref 20–31)
Calcium: 9.3 mg/dL (ref 8.6–10.3)
Creat: 1.44 mg/dL — ABNORMAL HIGH (ref 0.70–1.25)
GLUCOSE: 92 mg/dL (ref 65–99)
POTASSIUM: 3.9 mmol/L (ref 3.5–5.3)
SODIUM: 140 mmol/L (ref 135–146)

## 2015-05-02 LAB — LIPID PANEL
CHOL/HDL RATIO: 4.5 ratio (ref <=5.0)
CHOLESTEROL: 131 mg/dL (ref 125–200)
HDL: 29 mg/dL — ABNORMAL LOW (ref >=40)
LDL Cholesterol: 79 mg/dL (ref <130)
Triglycerides: 113 mg/dL (ref <150)
VLDL: 23 mg/dL (ref <30)

## 2015-05-02 LAB — HEPATIC FUNCTION PANEL
ALBUMIN: 4.1 g/dL (ref 3.6–5.1)
ALK PHOS: 61 U/L (ref 40–115)
ALT: 60 U/L — AB (ref 9–46)
AST: 35 U/L (ref 10–35)
BILIRUBIN DIRECT: 0.1 mg/dL (ref <=0.2)
BILIRUBIN TOTAL: 0.4 mg/dL (ref 0.2–1.2)
Indirect Bilirubin: 0.3 mg/dL (ref 0.2–1.2)
Total Protein: 6.9 g/dL (ref 6.1–8.1)

## 2015-05-12 ENCOUNTER — Ambulatory Visit (INDEPENDENT_AMBULATORY_CARE_PROVIDER_SITE_OTHER): Payer: Medicare Other | Admitting: *Deleted

## 2015-05-12 ENCOUNTER — Telehealth: Payer: Self-pay | Admitting: Cardiology

## 2015-05-12 DIAGNOSIS — Z23 Encounter for immunization: Secondary | ICD-10-CM

## 2015-05-12 DIAGNOSIS — E785 Hyperlipidemia, unspecified: Secondary | ICD-10-CM

## 2015-05-12 NOTE — Telephone Encounter (Signed)
-----   Message from [name], MD sent at 05/03/2015  6:35 PM EST ----- LDL just above goal.  Please have patient watch fats and carbs in his diet and eat more fruits and vegetables.  Recheck ALT in 4 weeks since it is elevated - suspect fatty liver.  AST is ok.  Recheck FLP in 4 months

## 2015-05-12 NOTE — Telephone Encounter (Signed)
Informed patient of results and verbal understanding expressed.  Instructed patient to limit fats and carbs in his diet and to eat more fruits and vegetables. ALT to be drawn 12/19. FLP to be drawn 3/20. Patient agrees with treatment plan.

## 2015-05-12 NOTE — Telephone Encounter (Signed)
F/u  Pt returning RN phone call- lab work. Please call back and discuss,.

## 2015-06-02 ENCOUNTER — Other Ambulatory Visit (INDEPENDENT_AMBULATORY_CARE_PROVIDER_SITE_OTHER): Payer: Medicare Other | Admitting: *Deleted

## 2015-06-02 DIAGNOSIS — E785 Hyperlipidemia, unspecified: Secondary | ICD-10-CM

## 2015-06-02 LAB — ALT: ALT: 33 U/L (ref 9–46)

## 2015-06-02 LAB — LIPID PANEL
CHOL/HDL RATIO: 4.7 ratio (ref <=5.0)
CHOLESTEROL: 117 mg/dL — AB (ref 125–200)
HDL: 25 mg/dL — AB (ref >=40)
LDL Cholesterol: 64 mg/dL (ref <130)
TRIGLYCERIDES: 139 mg/dL (ref <150)
VLDL: 28 mg/dL (ref <30)

## 2015-06-11 ENCOUNTER — Ambulatory Visit (INDEPENDENT_AMBULATORY_CARE_PROVIDER_SITE_OTHER): Payer: Medicare Other | Admitting: Family Medicine

## 2015-06-11 VITALS — BP 130/60 | HR 63 | Temp 97.9°F | Resp 14 | Ht 72.0 in | Wt 243.8 lb

## 2015-06-11 DIAGNOSIS — M25562 Pain in left knee: Secondary | ICD-10-CM | POA: Diagnosis not present

## 2015-06-11 DIAGNOSIS — M25561 Pain in right knee: Secondary | ICD-10-CM | POA: Diagnosis not present

## 2015-06-11 DIAGNOSIS — G8929 Other chronic pain: Secondary | ICD-10-CM

## 2015-06-11 DIAGNOSIS — I255 Ischemic cardiomyopathy: Secondary | ICD-10-CM | POA: Diagnosis not present

## 2015-06-11 MED ORDER — TRAMADOL HCL 50 MG PO TABS: 50.0000 mg | ORAL_TABLET | Freq: Three times a day (TID) | ORAL | Status: DC | PRN

## 2015-06-11 MED ORDER — TRAMADOL HCL 50 MG PO TABS: 50.0000 mg | ORAL_TABLET | Freq: Three times a day (TID) | ORAL | Status: AC | PRN

## 2015-06-11 NOTE — Progress Notes (Addendum)
Subjective:  By signing my name below, I, [name], attest that this documentation has been prepared under the direction and in the presence of Andre Ray, MD.  Electronically Signed: [name], ED Scribe. 06/11/2015. 5:13 PM.  Patient ID: Andre Cook., male    DOB: [birth year], 69 y.o.   MRN: [id number]  HPI   Chief Complaint  Patient presents with  . Medication Refill    tramadol   HPI Comments: Andre Cook. is a 69 y.o. male who presents to the [facility] for a medication refill of Tramadol. Hx of multiple medical problems. Previous PCP [name]. Last seen by [name] in August for a physical. Seen in June for bilateral Knee pain. At that time he used tramadol for a year for arthritis. No hx of surgery. At that time taking tramadol 4 per day. Advised to decrease use to TID with supplement tylenol in between. Last prescription filled Jun 26 #90 with 3 refills. Most recent XR of right knee 2015 with mild degenerative change. Referred in 10/2013 for bilateral knee pain.    Lab Results  Component Value Date   ALT 33 06/02/2015   AST 35 05/02/2015   ALKPHOS 61 05/02/2015   BILITOT 0.4 05/02/2015   Pt reports hx of arthritis for a couple years, right knee worse than left. He states he's received injection in both knees a couple years ago here. He does not remember if he has been seen by orthopedist in the past. He takes Tramadol 3 times a day and BC powder 4 times a day. He stopped taking tylenol due to dental work.   Patient Active Problem List   Diagnosis Date Noted  . Mitral regurgitation   . PVC (premature ventricular contraction) 09/07/2014  . Pain, joint, multiple sites 06/27/2014  . Sleep disorder 06/27/2014  . Change in bowel habits 12/31/2013  . Dysphagia, unspecified(787.20) 12/31/2013  . Esophageal reflux 12/31/2013  . Hypertrophy of prostate without urinary obstruction and other lower urinary tract symptoms (LUTS)  11/26/2012  . Cardiomyopathy, ischemic 11/10/2012  . CAD (coronary artery disease) 07/02/2011  . Obesity 07/02/2011  . Hypothyroidism, postablative   . CARCINOMA, RENAL CELL 10/31/2008  . Dyslipidemia 10/31/2008  . Essential hypertension 10/31/2008  . MYOCARDIAL INFARCTION 10/31/2008   Past Medical History  Diagnosis Date  . 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 Hypertension   . Nephrolithiasis   . Dyslipidemia   . GERD (gastroesophageal reflux disease)   . Hyperglycemia   . Obesity   . MI (myocardial infarction) ([place]) 2002    post- op 2002, severe 3 vessel disease, CABG.  Inferior wall MI  . 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 [name]  . Anxiety   . Heart disease   . Hypercholesterolemia   . Hypertrophy of prostate with urinary obstruction and other lower urinary tract symptoms (LUTS)   . Allergy   . Arthritis   . Cardiomyopathy, ischemic     EF 45% by echo 06/2014  . Mitral regurgitation     mild to moderate by echo 06/2014  . CAD (coronary artery disease) 2002    Post- op MI, CABG due to severe 3 vessel disease with LIMA to LAD, SVG to D1, SVG to LCX, SVG to PDA.  Repeat cath 05/2008 showed occluded SVG to RCA   Past Surgical History  Procedure Laterality Date  . Appendectomy    .  Coronary artery bypass graft  2007    4 vessels treated  . Tonsillectomy and adenoidectomy    . Finger amputation      right 4th finger: industrial accident  . 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 Renal biopsy    . Cardiac surgery    . Thyroidectomy     Allergies  Allergen Reactions  . Ciprofloxacin Rash   Prior to Admission medications   Medication Sig Start Date End Date Taking? Authorizing Provider  acetaminophen (TYLENOL) 325 MG tablet Take 325 mg by mouth every 6 (six) hours as needed.   Yes Historical Provider, MD  aspirin EC 81 MG tablet Take 1 tablet (81 mg total) by mouth daily. 10/29/14  Yes [name], MD  atorvastatin (LIPITOR) 20 MG tablet Take 20 mg by mouth daily.   Yes Historical Provider, MD  bisoprolol (ZEBETA) 10 MG tablet Take 10 mg by mouth daily.   Yes Historical Provider, MD  cholecalciferol (VITAMIN D) 1000 UNITS tablet Take 1,000 Units by mouth daily.   Yes Historical Provider, MD  Choline Fenofibrate (FENOFIBRIC ACID) 45 MG CPDR Take 45 mg by mouth daily. 09/30/14  Yes Historical Provider, MD  desloratadine (CLARINEX) 5 MG tablet Take 1 tablet (5 mg total) by mouth daily. 09/25/14  Yes [name], MD  Homeopathic Products (ARNICARE ARTHRITIS PO) Take 2 tablets by mouth daily. Dissolve 2 tablets under the tongue every 15 min, for min. Of 3 doses. Then 2 tablets hourly until symptoms improves, per patient.   Yes Historical Provider, MD  levothyroxine (SYNTHROID) 112 MCG tablet Take 1 tablet (112 mcg total) by mouth daily. 06/08/14  Yes [name], MD  metoprolol (LOPRESSOR) 50 MG tablet Take 1 tablet (50 mg total) by mouth 2 (two) times daily. 06/08/14  Yes [name], MD  omeprazole (PRILOSEC) 20 MG capsule Take 20 mg by mouth daily.   Yes Historical Provider, MD  ramipril (ALTACE) 10 MG capsule Take 10 mg by mouth 2 (two) times daily.   Yes Historical Provider, MD  Sennosides (EX-LAX) 15 MG TABS Take 15 mg by mouth daily as needed (as needed for contipation).   Yes Historical Provider, MD  tamsulosin (FLOMAX) 0.4 MG CAPS capsule Take 1 capsule (0.4 mg total) by mouth daily. 04/09/15  Yes [name], MD  traMADol (ULTRAM) 50 MG tablet Take 1 tablet (50 mg total) by mouth 3 (three) times daily as needed. 12/08/14  Yes [name], MD  triamterene-hydrochlorothiazide (MAXZIDE-25) 37.5-25 MG per tablet Take 1 tablet by mouth daily.   Yes Historical Provider, MD  fluticasone (FLONASE) 50 MCG/ACT nasal spray Place 2 sprays into both nostrils daily. Take two sprays in each nostril daily. 11/07/14   [name], PA-C   Social History   Social History  .  Marital Status: Legally Separated    Spouse Name: N/A  . Number of Children: N/A  . Years of Education: N/A   Occupational History  . Retired    Social History Main Topics  . Smoking status: Former Smoker    Types: Cigarettes    Quit date: 10/31/2005  . Smokeless tobacco: Never Used  . Alcohol Use: No  . Drug Use: No  . Sexual Activity: No   Other Topics Concern  . Not on file   Social History Narrative   Single. Education: [employer]. Exercise: Yes.   Review of Systems  Constitutional: Negative for fever and  chills.  Musculoskeletal: Positive for arthralgias. Negative for gait problem.  Skin: Negative for color change and rash.  Neurological: Negative for weakness and numbness.   Objective:   Physical Exam  Constitutional: He is oriented to person, place, and time. He appears well-developed and well-nourished. No distress.  HENT:  Head: Normocephalic and atraumatic.  Eyes: Conjunctivae and EOM are normal.  Neck: Neck supple.  Cardiovascular: Normal rate.   Pulmonary/Chest: Effort normal.  Musculoskeletal: Normal range of motion.  Right knee: crepitance of the right knee. Skin intact. No erythema. No apparent effusion. Non tender on exam. Full ROM. Left knee: no focal tenderness along left knee. Skin is intact. No erythema. No effusion. Full ROM.   Neurological: He is alert and oriented to person, place, and time.  Skin: Skin is warm and dry.  Psychiatric: He has a normal mood and affect. His behavior is normal.  Nursing note and vitals reviewed.  Filed Vitals:   06/11/15 1516  BP: 130/60  Pulse: 63  Temp: 97.9 F (36.6 C)  TempSrc: Oral  Resp: 14  Height: 6' (1.829 m)  Weight: 243 lb 12.8 oz (110.587 kg)  SpO2: 96%   Assessment & Plan:   Ultram was reprinted to remove fill date.    Seith Shinder. is a 69 y.o. male Bilateral chronic knee pain - Plan: AMB referral to orthopedics  Knee pain, bilateral - Plan: traMADol (ULTRAM) 50 MG tablet,  DISCONTINUED: traMADol (ULTRAM) 50 MG tablet  Chronic bilateral knee pain. Requiring Ultram 3 times per day, previously 4 times per day.  - He has not seen an orthopedist as referred back in 2015. I place a new referral for him. Discussed he may benefit from either a corticosteroid injection or possible Visco supplementation, lessening the need for tramadol.  - If he does require tramadol persistently, may need to be referred to pain management for chronic use of this medicine. Advised to select new primary care provider as previous provider is no longer at our office.   -Advised against NSAIDs and cautioned on use of BC powder with caffeine due to heart history.   -Need to return the next 1 month if he has not seen orthopedics or needs refill of tramadol.  Policy given regarding control medication.  -RTC precautions if worsening.  Meds ordered this encounter  Medications  . bisoprolol (ZEBETA) 10 MG tablet    Sig: Take 10 mg by mouth daily.  Marland Kitchen DISCONTD: traMADol (ULTRAM) 50 MG tablet    Sig: Take 1 tablet (50 mg total) by mouth 3 (three) times daily as needed.    Dispense:  90 tablet    Refill:  0    May fill on or after October 08, 2014  . traMADol (ULTRAM) 50 MG tablet    Sig: Take 1 tablet (50 mg total) by mouth 3 (three) times daily as needed.    Dispense:  90 tablet    Refill:  0   Patient Instructions  I will refer you to orthopedics to discuss options for your knee pain. For now I would also try to cut back on the [facility] powder as it contains caffeine. Tylenol if needed for mild pain, or tramadol him if needed for moderate to severe pain. Avoid NSAIDs like ibuprofen or Aleve with your heart history.  After your visit with orthopedist, if you are continuing to require tramadol frequently, return so we can discuss plan for your tramadol, which may be a referral to pain management if  your new primary provider is not able to provide long term refills of this medicine. Please also see the [facility]  clinic policy below regarding these medications. Please let me know if there are any questions in the meantime. Return to the clinic or go to the nearest emergency room if any of your symptoms worsen or new symptoms occur.  Knee Pain Knee pain is a very common symptom and can have many causes. Knee pain often goes away when you follow your health care provider's instructions for relieving pain and discomfort at home. However, knee pain can develop into a condition that needs treatment. Some conditions may include:  Arthritis caused by wear and tear (osteoarthritis).  Arthritis caused by swelling and irritation (rheumatoid arthritis or gout).  A cyst or growth in your knee.  An infection in your knee joint.  An injury that will not heal.  Damage, swelling, or irritation of the tissues that support your knee (torn ligaments or tendinitis). If your knee pain continues, additional tests may be ordered to diagnose your condition. Tests may include X-rays or other imaging studies of your knee. You may also need to have fluid removed from your knee. Treatment for ongoing knee pain depends on the cause, but treatment may include:  Medicines to relieve pain or swelling.  Steroid injections in your knee.  Physical therapy.  Surgery. HOME CARE INSTRUCTIONS  Take medicines only as directed by your health care provider.  Rest your knee and keep it raised (elevated) while you are resting.  Do not do things that cause or worsen pain.  Avoid high-impact activities or exercises, such as running, jumping rope, or doing jumping jacks.  Apply ice to the knee area:  Put ice in a plastic bag.  Place a towel between your skin and the bag.  Leave the ice on for 20 minutes, 2-3 times a day.  Ask your health care provider if you should wear an elastic knee support.  Keep a pillow under your knee when you sleep.  Lose weight if you are overweight. Extra weight can put pressure on your knee.  Do  not use any tobacco products, including cigarettes, chewing tobacco, or electronic cigarettes. If you need help quitting, ask your health care provider. Smoking may slow the healing of any bone and joint problems that you may have. SEEK MEDICAL CARE IF:  Your knee pain continues, changes, or gets worse.  You have a fever along with knee pain.  Your knee buckles or locks up.  Your knee becomes more swollen. SEEK IMMEDIATE MEDICAL CARE IF:   Your knee joint feels hot to the touch.  You have chest pain or trouble breathing.   This information is not intended to replace advice given to you by your health care provider. Make sure you discuss any questions you have with your health care provider.   Document Released: 03/28/2007 Document Revised: 06/21/2014 Document Reviewed: 01/14/2014 Elsevier Interactive Patient Education 2016 [place] for Prescribing Controlled Substances (Revised 04/2012) 1. Prescriptions for controlled substances will be filled by ONE provider at [facility] with whom you have established and developed a plan for your care, including follow-up. 2. You are encouraged to schedule an appointment with your prescriber at our appointment center for follow-up visits whenever possible. 3. If you request a prescription for the controlled substance while at [facility] for an acute problem (with someone other than your regular prescriber), you MAY be given a ONE-TIME prescription for a 30-day supply of  the controlled substance, to allow time for you to return to see your regular prescriber for additional prescriptions.      'I personally performed the services described in this documentation, which was scribed in my presence. The recorded information has been reviewed and considered, and addended by me as needed.

## 2015-06-11 NOTE — Patient Instructions (Addendum)
I will refer you to orthopedics to discuss options for your knee pain. For now I would also try to cut back on the [facility] powder as it contains caffeine. Tylenol if needed for mild pain, or tramadol him if needed for moderate to severe pain. Avoid NSAIDs like ibuprofen or Aleve with your heart history.  After your visit with orthopedist, if you are continuing to require tramadol frequently, return so we can discuss plan for your tramadol, which may be a referral to pain management if your new primary provider is not able to provide long term refills of this medicine. Please also see the [facility] clinic policy below regarding these medications. Please let me know if there are any questions in the meantime. Return to the clinic or go to the nearest emergency room if any of your symptoms worsen or new symptoms occur.  Knee Pain Knee pain is a very common symptom and can have many causes. Knee pain often goes away when you follow your health care provider's instructions for relieving pain and discomfort at home. However, knee pain can develop into a condition that needs treatment. Some conditions may include:  Arthritis caused by wear and tear (osteoarthritis).  Arthritis caused by swelling and irritation (rheumatoid arthritis or gout).  A cyst or growth in your knee.  An infection in your knee joint.  An injury that will not heal.  Damage, swelling, or irritation of the tissues that support your knee (torn ligaments or tendinitis). If your knee pain continues, additional tests may be ordered to diagnose your condition. Tests may include X-rays or other imaging studies of your knee. You may also need to have fluid removed from your knee. Treatment for ongoing knee pain depends on the cause, but treatment may include:  Medicines to relieve pain or swelling.  Steroid injections in your knee.  Physical therapy.  Surgery. HOME CARE INSTRUCTIONS  Take medicines only as directed by your health care  provider.  Rest your knee and keep it raised (elevated) while you are resting.  Do not do things that cause or worsen pain.  Avoid high-impact activities or exercises, such as running, jumping rope, or doing jumping jacks.  Apply ice to the knee area:  Put ice in a plastic bag.  Place a towel between your skin and the bag.  Leave the ice on for 20 minutes, 2-3 times a day.  Ask your health care provider if you should wear an elastic knee support.  Keep a pillow under your knee when you sleep.  Lose weight if you are overweight. Extra weight can put pressure on your knee.  Do not use any tobacco products, including cigarettes, chewing tobacco, or electronic cigarettes. If you need help quitting, ask your health care provider. Smoking may slow the healing of any bone and joint problems that you may have. SEEK MEDICAL CARE IF:  Your knee pain continues, changes, or gets worse.  You have a fever along with knee pain.  Your knee buckles or locks up.  Your knee becomes more swollen. SEEK IMMEDIATE MEDICAL CARE IF:   Your knee joint feels hot to the touch.  You have chest pain or trouble breathing.   This information is not intended to replace advice given to you by your health care provider. Make sure you discuss any questions you have with your health care provider.   Document Released: 03/28/2007 Document Revised: 06/21/2014 Document Reviewed: 01/14/2014 Elsevier Interactive Patient Education 2016 [place] for Prescribing  Controlled Substances (Revised 04/2012) 1. Prescriptions for controlled substances will be filled by ONE provider at [facility] with whom you have established and developed a plan for your care, including follow-up. 2. You are encouraged to schedule an appointment with your prescriber at our appointment center for follow-up visits whenever possible. 3. If you request a prescription for the controlled substance while at [facility] for an acute problem  (with someone other than your regular prescriber), you MAY be given a ONE-TIME prescription for a 30-day supply of the controlled substance, to allow time for you to return to see your regular prescriber for additional prescriptions.

## 2015-07-05 ENCOUNTER — Other Ambulatory Visit: Payer: Self-pay | Admitting: Emergency Medicine

## 2015-07-06 ENCOUNTER — Ambulatory Visit (INDEPENDENT_AMBULATORY_CARE_PROVIDER_SITE_OTHER): Payer: Medicare Other | Admitting: Emergency Medicine

## 2015-07-06 VITALS — BP 142/70 | HR 65 | Temp 97.6°F | Resp 16 | Ht 72.0 in | Wt 238.0 lb

## 2015-07-06 DIAGNOSIS — E785 Hyperlipidemia, unspecified: Secondary | ICD-10-CM | POA: Diagnosis not present

## 2015-07-06 DIAGNOSIS — N400 Enlarged prostate without lower urinary tract symptoms: Secondary | ICD-10-CM | POA: Diagnosis not present

## 2015-07-06 DIAGNOSIS — I10 Essential (primary) hypertension: Secondary | ICD-10-CM

## 2015-07-06 DIAGNOSIS — I255 Ischemic cardiomyopathy: Secondary | ICD-10-CM

## 2015-07-06 DIAGNOSIS — E039 Hypothyroidism, unspecified: Secondary | ICD-10-CM

## 2015-07-06 LAB — COMPLETE METABOLIC PANEL WITH GFR
ALT: 20 U/L (ref 9–46)
AST: 15 U/L (ref 10–35)
Albumin: 4.4 g/dL (ref 3.6–5.1)
Alkaline Phosphatase: 61 U/L (ref 40–115)
BUN: 33 mg/dL — ABNORMAL HIGH (ref 7–25)
CHLORIDE: 104 mmol/L (ref 98–110)
CO2: 28 mmol/L (ref 20–31)
CREATININE: 1.13 mg/dL (ref 0.70–1.25)
Calcium: 10.1 mg/dL (ref 8.6–10.3)
GFR, EST AFRICAN AMERICAN: 77 mL/min (ref >=60)
GFR, EST NON AFRICAN AMERICAN: 67 mL/min (ref >=60)
Glucose, Bld: 103 mg/dL — ABNORMAL HIGH (ref 65–99)
Potassium: 4.4 mmol/L (ref 3.5–5.3)
SODIUM: 141 mmol/L (ref 135–146)
Total Bilirubin: 0.5 mg/dL (ref 0.2–1.2)
Total Protein: 7.1 g/dL (ref 6.1–8.1)

## 2015-07-06 LAB — LIPID PANEL
Cholesterol: 144 mg/dL (ref 125–200)
HDL: 32 mg/dL — AB (ref >=40)
LDL CALC: 79 mg/dL (ref <130)
TRIGLYCERIDES: 163 mg/dL — AB (ref <150)
Total CHOL/HDL Ratio: 4.5 Ratio (ref <=5.0)
VLDL: 33 mg/dL — AB (ref <30)

## 2015-07-06 LAB — POCT CBC
Granulocyte percent: 63.1 %G (ref 37–80)
HCT, POC: 43.5 % (ref 43.5–53.7)
HEMOGLOBIN: 15 g/dL (ref 14.1–18.1)
Lymph, poc: 2.4 (ref 0.6–3.4)
MCH: 31.3 pg — AB (ref 27–31.2)
MCHC: 34.4 g/dL (ref 31.8–35.4)
MCV: 90.9 fL (ref 80–97)
MID (cbc): 0.9 (ref 0–0.9)
MPV: 8.2 fL (ref 0–99.8)
POC Granulocyte: 5.7 (ref 2–6.9)
POC LYMPH PERCENT: 26.9 %L (ref 10–50)
POC MID %: 10 % (ref 0–12)
Platelet Count, POC: 257 10*3/uL (ref 142–424)
RBC: 4.79 M/uL (ref 4.69–6.13)
RDW, POC: 13.7 %
WBC: 9.1 10*3/uL (ref 4.6–10.2)

## 2015-07-06 LAB — TSH: TSH: 3.455 u[IU]/mL (ref 0.350–4.500)

## 2015-07-06 MED ORDER — FENOFIBRIC ACID 45 MG PO CPDR: 45.0000 mg | DELAYED_RELEASE_CAPSULE | Freq: Every day | ORAL | Status: DC

## 2015-07-06 MED ORDER — LEVOTHYROXINE SODIUM 112 MCG PO TABS: 112.0000 ug | ORAL_TABLET | Freq: Every day | ORAL | Status: AC

## 2015-07-06 MED ORDER — TAMSULOSIN HCL 0.4 MG PO CAPS: 0.4000 mg | ORAL_CAPSULE | Freq: Every day | ORAL | Status: DC

## 2015-07-06 MED ORDER — DESLORATADINE 5 MG PO TABS: 5.0000 mg | ORAL_TABLET | Freq: Every day | ORAL | Status: DC

## 2015-07-06 MED ORDER — ATORVASTATIN CALCIUM 20 MG PO TABS: 20.0000 mg | ORAL_TABLET | Freq: Every day | ORAL | Status: DC

## 2015-07-06 MED ORDER — TRIAMTERENE-HCTZ 37.5-25 MG PO TABS: 1.0000 | ORAL_TABLET | Freq: Every day | ORAL | Status: AC

## 2015-07-06 MED ORDER — METOPROLOL TARTRATE 50 MG PO TABS: 50.0000 mg | ORAL_TABLET | Freq: Two times a day (BID) | ORAL | Status: AC

## 2015-07-06 MED ORDER — RAMIPRIL 10 MG PO CAPS: 10.0000 mg | ORAL_CAPSULE | Freq: Two times a day (BID) | ORAL | Status: AC

## 2015-07-06 NOTE — Progress Notes (Signed)
By signing my name below, I, Andre Cook, attest that this documentation has been prepared under the direction and in the presence of Andre Jordan, MD. Electronically Signed: Judithe Cook, ER Scribe. 07/06/2015. 10:39 AM.  Chief Complaint:  Chief Complaint  Patient presents with  . Medication Refill    ramipril, metoprolol, triamterene, synthroid, atorvastatin, tamsulosin, desloratadine, fenofibric    HPI: Andre Cook. is a 70 y.o. male who reports to Andre Cook today for a medication refill. He has had a flu shot this year. He saw his cardiologist Dr. Radford Cook two months ago and she recommended he continue his statin medication and fenofibrate.     Past Medical History  Diagnosis Date  . 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 Hypertension   . Nephrolithiasis   . Dyslipidemia   . GERD (gastroesophageal reflux disease)   . Hyperglycemia   . Obesity   . MI (myocardial infarction) ([place]) 2002    post- op 2002, severe 3 vessel disease, CABG.  Inferior wall MI  . 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 Rosendo Cook  . Anxiety   . Heart disease   . Hypercholesterolemia   . Hypertrophy of prostate with urinary obstruction and other lower urinary tract symptoms (LUTS)   . Allergy   . Arthritis   . Cardiomyopathy, ischemic     EF 45% by echo 06/2014  . Mitral regurgitation     mild to moderate by echo 06/2014  . CAD (coronary artery disease) 2002    Post- op MI, CABG due to severe 3 vessel disease with LIMA to LAD, SVG to D1, SVG to LCX, SVG to PDA.  Repeat cath 05/2008 showed occluded SVG to RCA   Past Surgical History  Procedure Laterality Date  . Appendectomy    . Coronary artery bypass graft  2007    4 vessels treated  . Tonsillectomy and adenoidectomy    . Finger amputation      right 4th finger: industrial accident  . 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 Risa Cook, clear cell carcinoma  . Renal biopsy    . Cardiac surgery    .  Thyroidectomy     Social History   Social History  . Marital Status: Legally Separated    Spouse Name: N/A  . Number of Children: N/A  . Years of Education: N/A   Occupational History  . Retired    Social History Main Topics  . Smoking status: Former Smoker    Types: Cigarettes    Quit date: 10/31/2005  . Smokeless tobacco: Never Used  . Alcohol Use: No  . Drug Use: No  . Sexual Activity: No   Other Topics Concern  . None   Social History Narrative   Single. Education: [employer]. Exercise: Yes.   Family History  Problem Relation Age of Onset  . Heart disease Father   . COPD Mother   . Cancer Sister     Primary-  Lung  . Colon cancer Neg Hx   . Esophageal cancer Neg Hx   . Stomach cancer Neg Hx   . Rectal cancer Neg Hx    Allergies  Allergen Reactions  . Ciprofloxacin Rash   Prior to Admission medications   Medication Sig Start Date End Date Taking? Authorizing Provider  acetaminophen (TYLENOL) 325 MG tablet Take 325 mg by mouth every 6 (six) hours as needed.   Yes Historical Provider, MD  aspirin  EC 81 MG tablet Take 1 tablet (81 mg total) by mouth daily. 10/29/14  Yes Andre Margarita, MD  atorvastatin (LIPITOR) 20 MG tablet Take 20 mg by mouth daily.   Yes Historical Provider, MD  cholecalciferol (VITAMIN D) 1000 UNITS tablet Take 1,000 Units by mouth daily.   Yes Historical Provider, MD  Choline Fenofibrate (FENOFIBRIC ACID) 45 MG CPDR Take 45 mg by mouth daily. 09/30/14  Yes Historical Provider, MD  desloratadine (CLARINEX) 5 MG tablet Take 1 tablet (5 mg total) by mouth daily. 09/25/14  Yes Andre Russian, MD  fluticasone (FLONASE) 50 MCG/ACT nasal spray Place 2 sprays into both nostrils daily. Take two sprays in each nostril daily. 11/07/14  Yes Andre Coop, PA-C  Homeopathic Products (ARNICARE ARTHRITIS PO) Take 2 tablets by mouth daily. Dissolve 2 tablets under the tongue every 15 min, for min. Of 3 doses. Then 2 tablets hourly until symptoms improves, per  patient.   Yes Historical Provider, MD  levothyroxine (SYNTHROID) 112 MCG tablet Take 1 tablet (112 mcg total) by mouth daily. 06/08/14  Yes Andre Russian, MD  metoprolol (LOPRESSOR) 50 MG tablet Take 1 tablet (50 mg total) by mouth 2 (two) times daily. 06/08/14  Yes Andre Russian, MD  omeprazole (PRILOSEC) 20 MG capsule Take 20 mg by mouth daily.   Yes Historical Provider, MD  ramipril (ALTACE) 10 MG capsule Take 10 mg by mouth 2 (two) times daily.   Yes Historical Provider, MD  Sennosides (EX-LAX) 15 MG TABS Take 15 mg by mouth daily as needed (as needed for contipation).   Yes Historical Provider, MD  tamsulosin (FLOMAX) 0.4 MG CAPS capsule Take 1 capsule (0.4 mg total) by mouth daily. 04/09/15  Yes Andre Filler Copland, MD  traMADol (ULTRAM) 50 MG tablet Take 1 tablet (50 mg total) by mouth 3 (three) times daily as needed. 06/11/15  Yes Andre Agreste, MD  triamterene-hydrochlorothiazide (MAXZIDE-25) 37.5-25 MG per tablet Take 1 tablet by mouth daily.   Yes Historical Provider, MD  bisoprolol (ZEBETA) 10 MG tablet Take 10 mg by mouth daily.    Historical Provider, MD     ROS: The patient denies fevers, chills, night sweats, unintentional weight loss, chest pain, palpitations, wheezing, dyspnea on exertion, nausea, vomiting, abdominal pain, dysuria, hematuria, melena, numbness, weakness, or tingling.   All other systems have been reviewed and were otherwise negative with the exception of those mentioned in the HPI and as above.    PHYSICAL EXAM: Filed Vitals:   07/06/15 1025  BP: 142/70  Pulse: 65  Temp: 97.6 F (36.4 C)  Resp: 16   Body mass index is 32.27 kg/(m^2).   General: Alert, no acute distress HEENT:  Normocephalic, atraumatic, oropharynx patent. Eye: Andre Cook Andre Cook Cardiovascular: There is a sternotomy scar present Regular rate and rhythm, no rubs murmurs or gallops.  No Carotid bruits, radial pulse intact. No pedal edema.  Respiratory: Clear to auscultation bilaterally.   No wheezes, rales, or rhonchi.  No cyanosis, no use of accessory musculature Abdominal: No organomegaly, abdomen is soft and non-tender, positive bowel sounds.  No masses. Musculoskeletal: Gait intact. No edema, tenderness Skin: No rashes. Neurologic: Facial musculature symmetric. Psychiatric: Patient acts appropriately throughout our interaction. Lymphatic: No cervical or submandibular lymphadenopathy    LABS: Results for orders placed or performed in visit on 07/06/15  POCT CBC  Result Value Ref Range   WBC 9.1 4.6 - 10.2 K/uL   Lymph, poc 2.4 0.6 - 3.4  POC LYMPH PERCENT 26.9 10 - 50 %L   MID (cbc) 0.9 0 - 0.9   POC MID % 10.0 0 - 12 %M   POC Granulocyte 5.7 2 - 6.9   Granulocyte percent 63.1 37 - 80 %G   RBC 4.79 4.69 - 6.13 M/uL   Hemoglobin 15.0 14.1 - 18.1 g/dL   HCT, POC 43.5 43.5 - 53.7 %   MCV 90.9 80 - 97 fL   MCH, POC 31.3 (A) 27 - 31.2 pg   MCHC 34.4 31.8 - 35.4 g/dL   RDW, POC 13.7 %   Platelet Count, POC 257 142 - 424 K/uL   MPV 8.2 0 - 99.8 fL   Meds ordered this encounter  Medications  . triamterene-hydrochlorothiazide (MAXZIDE-25) 37.5-25 MG tablet    Sig: Take 1 tablet by mouth daily.    Dispense:  90 tablet    Refill:  3  . tamsulosin (FLOMAX) 0.4 MG CAPS capsule    Sig: Take 1 capsule (0.4 mg total) by mouth daily.    Dispense:  90 capsule    Refill:  3  . ramipril (ALTACE) 10 MG capsule    Sig: Take 1 capsule (10 mg total) by mouth 2 (two) times daily.    Dispense:  180 capsule    Refill:  3  . metoprolol (LOPRESSOR) 50 MG tablet    Sig: Take 1 tablet (50 mg total) by mouth 2 (two) times daily.    Dispense:  180 tablet    Refill:  3  . levothyroxine (SYNTHROID) 112 MCG tablet    Sig: Take 1 tablet (112 mcg total) by mouth daily.    Dispense:  90 tablet    Refill:  3  . desloratadine (CLARINEX) 5 MG tablet    Sig: Take 1 tablet (5 mg total) by mouth daily.    Dispense:  90 tablet    Refill:  3  . atorvastatin (LIPITOR) 20 MG tablet     Sig: Take 1 tablet (20 mg total) by mouth daily.    Dispense:  90 tablet    Refill:  3  . Choline Fenofibrate (FENOFIBRIC ACID) 45 MG CPDR    Sig: Take 45 mg by mouth daily.    Dispense:  90 capsule    Refill:  3    EKG/XRAY:   Primary read interpreted by Dr. Everlene Farrier at [facility].   ASSESSMENT/PLAN:  Cardiologist note reviewed, Dr. Radford Cook recommended the pt continue his statin and fenofibrate medications. Will refill. Apparently at some time his bisoprolol   was switched over to metoprolol. Labs were done today. We'll forward them to Dr. Maryan Puls er   Gross sideeffects, risk and benefits, and alternatives of medications d/w patient. Patient is aware that all medications have potential sideeffects and we are unable to predict every sideeffect or drug-drug interaction that may occur.  [name] MD 07/06/2015 10:39 AM

## 2015-07-12 ENCOUNTER — Encounter: Payer: Self-pay | Admitting: Family Medicine

## 2015-07-15 ENCOUNTER — Telehealth: Payer: Self-pay

## 2015-07-15 NOTE — Telephone Encounter (Signed)
Pt. Called back and he was advised of his lab results

## 2015-07-15 NOTE — Telephone Encounter (Signed)
Tried to call Pt. Because he received out letter that we were unable to reach him in regards to his lab results but he did not answer, left a vm to call back

## 2015-09-01 ENCOUNTER — Other Ambulatory Visit (INDEPENDENT_AMBULATORY_CARE_PROVIDER_SITE_OTHER): Payer: Medicare Other | Admitting: *Deleted

## 2015-09-01 DIAGNOSIS — E785 Hyperlipidemia, unspecified: Secondary | ICD-10-CM

## 2015-09-01 LAB — LIPID PANEL
CHOL/HDL RATIO: 5.3 ratio — AB (ref <=5.0)
Cholesterol: 155 mg/dL (ref 125–200)
HDL: 29 mg/dL — ABNORMAL LOW (ref >=40)
LDL CALC: 91 mg/dL (ref <130)
TRIGLYCERIDES: 176 mg/dL — AB (ref <150)
VLDL: 35 mg/dL — AB (ref <30)

## 2015-09-01 NOTE — Addendum Note (Signed)
Addended by: Eulis Foster on: 09/01/2015 08:35 AM   Modules accepted: Orders

## 2015-09-05 ENCOUNTER — Other Ambulatory Visit: Payer: Self-pay | Admitting: *Deleted

## 2015-09-05 DIAGNOSIS — E785 Hyperlipidemia, unspecified: Secondary | ICD-10-CM

## 2015-09-11 ENCOUNTER — Other Ambulatory Visit: Payer: Self-pay | Admitting: Emergency Medicine

## 2015-09-17 ENCOUNTER — Other Ambulatory Visit: Payer: Self-pay

## 2015-09-17 MED ORDER — DESLORATADINE 5 MG PO TABS: 5.0000 mg | ORAL_TABLET | Freq: Every day | ORAL | Status: AC

## 2015-10-02 ENCOUNTER — Other Ambulatory Visit: Payer: Self-pay | Admitting: Family Medicine

## 2015-10-07 ENCOUNTER — Other Ambulatory Visit (INDEPENDENT_AMBULATORY_CARE_PROVIDER_SITE_OTHER): Payer: Medicare Other | Admitting: *Deleted

## 2015-10-07 DIAGNOSIS — E785 Hyperlipidemia, unspecified: Secondary | ICD-10-CM | POA: Diagnosis not present

## 2015-10-07 LAB — LIPID PANEL
CHOLESTEROL: 141 mg/dL (ref 125–200)
HDL: 23 mg/dL — ABNORMAL LOW (ref >=40)
LDL CALC: 51 mg/dL (ref <130)
TRIGLYCERIDES: 336 mg/dL — AB (ref <150)
Total CHOL/HDL Ratio: 6.1 Ratio — ABNORMAL HIGH (ref <=5.0)
VLDL: 67 mg/dL — ABNORMAL HIGH (ref <30)

## 2015-10-07 LAB — ALT: ALT: 24 U/L (ref 9–46)

## 2015-10-10 MED ORDER — FENOFIBRATE 160 MG PO TABS: 160.0000 mg | ORAL_TABLET | Freq: Every day | ORAL | Status: AC

## 2015-10-10 NOTE — Addendum Note (Signed)
Addended by: Elberta Leatherwood R on: 10/10/2015 03:48 PM   Modules accepted: Orders, Medications

## 2015-11-06 ENCOUNTER — Encounter: Payer: Self-pay | Admitting: Cardiology

## 2015-11-06 ENCOUNTER — Ambulatory Visit (INDEPENDENT_AMBULATORY_CARE_PROVIDER_SITE_OTHER): Payer: Medicare Other | Admitting: Cardiology

## 2015-11-06 VITALS — BP 148/80 | HR 62 | Ht 72.0 in | Wt 245.0 lb

## 2015-11-06 DIAGNOSIS — I2510 Atherosclerotic heart disease of native coronary artery without angina pectoris: Secondary | ICD-10-CM

## 2015-11-06 DIAGNOSIS — I10 Essential (primary) hypertension: Secondary | ICD-10-CM

## 2015-11-06 DIAGNOSIS — E785 Hyperlipidemia, unspecified: Secondary | ICD-10-CM

## 2015-11-06 DIAGNOSIS — I255 Ischemic cardiomyopathy: Secondary | ICD-10-CM

## 2015-11-06 DIAGNOSIS — I2583 Coronary atherosclerosis due to lipid rich plaque: Secondary | ICD-10-CM

## 2015-11-06 NOTE — Patient Instructions (Signed)

## 2015-11-06 NOTE — Progress Notes (Signed)
Cardiology Office Note    Date:  11/06/2015   ID:  Andre Cook., DOB April 20, 1946, MRN [id number]  PCP:  Ellsworth Lennox, MD  Cardiologist:  Fransico Him, MD   Chief Complaint  Patient presents with  . Coronary Artery Disease  . Hypertension  . Hyperlipidemia    History of Present Illness:  Andre Cook. is a 70 y.o. male who presents for followup. He has a history of ASCAD with CABG in 2002 with occluded SVG to RCA by cath 05/2008. 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 Most recent echo 06/2014 showed EF 45%. He was denied an AICD by his insurance initially when EF low but now not indicated. He also has a history of HTN and dyslipidemia. he also has a history of PVC's. He denies any chest pain (except for 1 remote episode), SOB, DOE, PND, orthopnea, LE edema, palpitations or syncope.    Past Medical History  Diagnosis Date  . 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 Hypertension   . Nephrolithiasis   . Dyslipidemia   . GERD (gastroesophageal reflux disease)   . Hyperglycemia   . Obesity   . 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 [name]  . Anxiety   . Heart disease   . Hypercholesterolemia   . Hypertrophy of prostate with urinary obstruction and other lower urinary tract symptoms (LUTS)   . Allergy   . Arthritis   . Cardiomyopathy, ischemic     EF 45% by echo 06/2014  . Mitral regurgitation     mild to moderate by echo 06/2014  . CAD (coronary artery disease) 2002    Post- op MI, CABG due to severe 3 vessel disease with LIMA to LAD, SVG to D1, SVG to LCX, SVG to PDA.  Repeat cath 05/2008 showed occluded SVG to RCA    Past Surgical History  Procedure Laterality Date  . Appendectomy    . Coronary artery bypass graft  2007    4 vessels treated  . Tonsillectomy and adenoidectomy    . Finger amputation      right 4th finger: industrial accident  . Partial  nephrectomy  01/2001    Dr. Risa Grill, clear cell carcinoma  . Renal biopsy    . Cardiac surgery    . Thyroidectomy      Current Medications: Outpatient Prescriptions Prior to Visit  Medication Sig Dispense Refill  . acetaminophen (TYLENOL) 325 MG tablet Take 325 mg by mouth every 6 (six) hours as needed.    Marland Kitchen aspirin EC 81 MG tablet Take 1 tablet (81 mg total) by mouth daily. 90 tablet 3  . atorvastatin (LIPITOR) 20 MG tablet Take 2 tablets at dinner (total of 40 mg) 90 tablet 3  . cholecalciferol (VITAMIN D) 1000 UNITS tablet Take 1,000 Units by mouth daily.    Marland Kitchen desloratadine (CLARINEX) 5 MG tablet Take 1 tablet (5 mg total) by mouth daily. 90 tablet 2  . fenofibrate 160 MG tablet Take 1 tablet (160 mg total) by mouth daily. 90 tablet 3  . fluticasone (FLONASE) 50 MCG/ACT nasal spray Place 2 sprays into both nostrils daily. Take two sprays in each nostril daily. 16 g 12  . Homeopathic Products (ARNICARE ARTHRITIS PO) Take 2 tablets by mouth daily. Dissolve 2 tablets under the tongue every 15 min, for min. Of 3 doses. Then 2 tablets hourly  until symptoms improves, per patient.    Marland Kitchen levothyroxine (SYNTHROID) 112 MCG tablet Take 1 tablet (112 mcg total) by mouth daily. 90 tablet 3  . metoprolol (LOPRESSOR) 50 MG tablet Take 1 tablet (50 mg total) by mouth 2 (two) times daily. 180 tablet 3  . omeprazole (PRILOSEC) 20 MG capsule Take 20 mg by mouth daily.    . ramipril (ALTACE) 10 MG capsule Take 1 capsule (10 mg total) by mouth 2 (two) times daily. 180 capsule 3  . Sennosides (EX-LAX) 15 MG TABS Take 15 mg by mouth daily as needed (as needed for contipation).    . tamsulosin (FLOMAX) 0.4 MG CAPS capsule TAKE 1 CAPSULE DAILY 90 capsule 1  . traMADol (ULTRAM) 50 MG tablet Take 1 tablet (50 mg total) by mouth 3 (three) times daily as needed. 90 tablet 0  . triamterene-hydrochlorothiazide (MAXZIDE-25) 37.5-25 MG tablet Take 1 tablet by mouth daily. 90 tablet 3  . tamsulosin (FLOMAX) 0.4 MG CAPS  capsule Take 1 capsule (0.4 mg total) by mouth daily. 90 capsule 3   No facility-administered medications prior to visit.     Allergies:   Ciprofloxacin   Social History   Social History  . Marital Status: Legally Separated    Spouse Name: N/A  . Number of Children: N/A  . Years of Education: N/A   Occupational History  . Retired    Social History Main Topics  . Smoking status: Former Smoker    Types: Cigarettes    Quit date: 10/31/2005  . Smokeless tobacco: Never Used  . Alcohol Use: No  . Drug Use: No  . Sexual Activity: No   Other Topics Concern  . None   Social History Narrative   Single. Education: [employer]. Exercise: Yes.     Family History:  The patient's family history includes COPD in his mother; Cancer in his sister; Heart disease in his father. There is no history of Colon cancer, Esophageal cancer, Stomach cancer, or Rectal cancer.   ROS:   Please see the history of present illness.    Review of Systems  Constitution: Positive for malaise/fatigue.  Neurological: Positive for dizziness.   All other systems reviewed and are negative.   PHYSICAL EXAM:   VS:  BP 148/80 mmHg  Pulse 62  Ht 6' (1.829 m)  Wt 245 lb (111.131 kg)  BMI 33.22 kg/m2   GEN: Well nourished, well developed, in no acute distress HEENT: normal Neck: no JVD, carotid bruits, or masses Cardiac: RRR; no murmurs, rubs, or gallops,no edema.  Intact distal pulses bilaterally.  Respiratory:  clear to auscultation bilaterally, normal work of breathing GI: soft, nontender, nondistended, + BS MS: no deformity or atrophy Skin: warm and dry, no rash Neuro:  Alert and Oriented x 3, Strength and sensation are intact Psych: euthymic mood, full affect  Wt Readings from Last 3 Encounters:  11/06/15 245 lb (111.131 kg)  07/06/15 238 lb (107.956 kg)  06/11/15 243 lb 12.8 oz (110.587 kg)      Studies/Labs Reviewed:   EKG:  EKG is  ordered today.  The ekg ordered today demonstrates NSR with  occasional PVCs and nonspecific ST abnormality and inferior infarct  Recent Labs: 01/27/2015: Platelets 299 07/06/2015: BUN 33*; Creat 1.13; Hemoglobin 15.0; Potassium 4.4; Sodium 141; TSH 3.455 10/07/2015: ALT 24   Lipid Panel    Component Value Date/Time   CHOL 141 10/07/2015 0815   TRIG 336* 10/07/2015 0815   HDL 23* 10/07/2015 0815   CHOLHDL 6.1*  10/07/2015 0815   VLDL 67* 10/07/2015 0815   LDLCALC 51 10/07/2015 0815    Additional studies/ records that were reviewed today include:  none    ASSESSMENT:    1. Essential hypertension   2. Coronary artery disease due to lipid rich plaque   3. Cardiomyopathy, ischemic   4. Dyslipidemia      PLAN:  In order of problems listed above:  1. HTN - controlled on current medical regimen.  Continue BB/ACE I/Maxide.   2. ASCAD post op MI and cath with severe 3 vessel ASCAD s/p CABG in 2002  with and repeat cath showed occluded SVG to RCA 05/2008 currently  with no angina.  Continue ASA/statin/BB 3. Ischemic DCM with EF 45%.  Continue ACEI, BB and diuretic. 4. Dyslipidemia - LDL goal < 70.  Last LDL 51.  Continue statin/fenofibrate.     Medication Adjustments/Labs and Tests Ordered: Current medicines are reviewed at length with the patient today.  Concerns regarding medicines are outlined above.  Medication changes, Labs and Tests ordered today are listed in the Patient Instructions below.  There are no Patient Instructions on file for this visit.   Signed, Fransico Him, MD  11/06/2015 2:49 PM    [address], [place], [place]  [address] Phone: [phone]; Fax: [phone]

## 2015-12-29 ENCOUNTER — Telehealth: Payer: Self-pay | Admitting: Cardiology

## 2015-12-29 NOTE — Telephone Encounter (Signed)
Original D/C received From [facility]-Dr.Turner has agreed to sign. Once signed call funeral home for pick up.

## 2015-12-30 ENCOUNTER — Telehealth: Payer: Self-pay | Admitting: Cardiology

## 2015-12-30 NOTE — Telephone Encounter (Signed)
Dr.Turner signed death certificate-I called [facility]. [address] [phone]) they are aware ready for pick up.

## 2016-01-13 DEATH — deceased

## 2018-12-01 ENCOUNTER — Encounter: Payer: Self-pay | Admitting: Gastroenterology
